# Patient Record
Sex: Male | Born: 1955 | Race: White | Hispanic: No | Marital: Married | Smoking: Never smoker
Health system: Southern US, Community
[De-identification: ages and names within clinical notes are randomized; demographics above are authoritative.]

## PROBLEM LIST (undated history)

## (undated) DIAGNOSIS — E7800 Pure hypercholesterolemia, unspecified: Secondary | ICD-10-CM

## (undated) DIAGNOSIS — A90 Dengue fever [classical dengue]: Secondary | ICD-10-CM

## (undated) DIAGNOSIS — N400 Enlarged prostate without lower urinary tract symptoms: Secondary | ICD-10-CM

## (undated) DIAGNOSIS — T7840XA Allergy, unspecified, initial encounter: Secondary | ICD-10-CM

## (undated) DIAGNOSIS — F419 Anxiety disorder, unspecified: Secondary | ICD-10-CM

## (undated) HISTORY — PX: HERNIA REPAIR: SHX51

## (undated) HISTORY — DX: Allergy, unspecified, initial encounter: T78.40XA

## (undated) HISTORY — DX: Benign prostatic hyperplasia without lower urinary tract symptoms: N40.0

## (undated) HISTORY — PX: PROSTATE BIOPSY: SHX241

## (undated) HISTORY — PX: COLONOSCOPY: SHX174

## (undated) HISTORY — DX: Anxiety disorder, unspecified: F41.9

---
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
## 2010-07-23 DIAGNOSIS — A90 Dengue fever [classical dengue]: Secondary | ICD-10-CM

## 2010-07-23 HISTORY — DX: Dengue fever (classical dengue): A90

## 2013-11-14 ENCOUNTER — Encounter (HOSPITAL_COMMUNITY): Payer: Self-pay | Admitting: Emergency Medicine

## 2013-11-14 ENCOUNTER — Inpatient Hospital Stay (HOSPITAL_COMMUNITY): Payer: Managed Care, Other (non HMO)

## 2013-11-14 ENCOUNTER — Inpatient Hospital Stay (HOSPITAL_COMMUNITY)
Admission: EM | Admit: 2013-11-14 | Discharge: 2013-11-16 | DRG: 607 | Disposition: A | Discharge status: Home / Self Care | Payer: Managed Care, Other (non HMO) | Attending: Internal Medicine | Admitting: Internal Medicine

## 2013-11-14 DIAGNOSIS — E860 Dehydration: Secondary | ICD-10-CM | POA: Diagnosis present

## 2013-11-14 DIAGNOSIS — E7800 Pure hypercholesterolemia, unspecified: Secondary | ICD-10-CM | POA: Diagnosis present

## 2013-11-14 DIAGNOSIS — IMO0001 Reserved for inherently not codable concepts without codable children: Secondary | ICD-10-CM | POA: Diagnosis present

## 2013-11-14 DIAGNOSIS — T7840XA Allergy, unspecified, initial encounter: Secondary | ICD-10-CM | POA: Diagnosis present

## 2013-11-14 DIAGNOSIS — R3129 Other microscopic hematuria: Secondary | ICD-10-CM | POA: Diagnosis present

## 2013-11-14 DIAGNOSIS — Z79899 Other long term (current) drug therapy: Secondary | ICD-10-CM

## 2013-11-14 DIAGNOSIS — N410 Acute prostatitis: Secondary | ICD-10-CM | POA: Diagnosis present

## 2013-11-14 DIAGNOSIS — N419 Inflammatory disease of prostate, unspecified: Secondary | ICD-10-CM | POA: Diagnosis present

## 2013-11-14 DIAGNOSIS — R21 Rash and other nonspecific skin eruption: Secondary | ICD-10-CM | POA: Diagnosis present

## 2013-11-14 DIAGNOSIS — L270 Generalized skin eruption due to drugs and medicaments taken internally: Principal | ICD-10-CM | POA: Diagnosis present

## 2013-11-14 DIAGNOSIS — R509 Fever, unspecified: Secondary | ICD-10-CM | POA: Diagnosis present

## 2013-11-14 DIAGNOSIS — R319 Hematuria, unspecified: Secondary | ICD-10-CM

## 2013-11-14 DIAGNOSIS — I10 Essential (primary) hypertension: Secondary | ICD-10-CM | POA: Diagnosis present

## 2013-11-14 DIAGNOSIS — T50995A Adverse effect of other drugs, medicaments and biological substances, initial encounter: Secondary | ICD-10-CM

## 2013-11-14 DIAGNOSIS — A90 Dengue fever [classical dengue]: Secondary | ICD-10-CM | POA: Diagnosis present

## 2013-11-14 HISTORY — DX: Pure hypercholesterolemia, unspecified: E78.00

## 2013-11-14 HISTORY — DX: Dengue fever (classical dengue): A90

## 2013-11-14 LAB — CBC WITH DIFFERENTIAL/PLATELET
BASOS ABS: 0.1 10*3/uL (ref 0.0–0.1)
Basophils Relative: 2 % — ABNORMAL HIGH (ref 0–1)
EOS PCT: 3 % (ref 0–5)
Eosinophils Absolute: 0.1 10*3/uL (ref 0.0–0.7)
HCT: 42.8 % (ref 39.0–52.0)
Hemoglobin: 15.3 g/dL (ref 13.0–17.0)
LYMPHS ABS: 1.1 10*3/uL (ref 0.7–4.0)
Lymphocytes Relative: 23 % (ref 12–46)
MCH: 31 pg (ref 26.0–34.0)
MCHC: 35.7 g/dL (ref 30.0–36.0)
MCV: 86.6 fL (ref 78.0–100.0)
Monocytes Absolute: 0.6 10*3/uL (ref 0.1–1.0)
Monocytes Relative: 12 % (ref 3–12)
NEUTROS ABS: 3 10*3/uL (ref 1.7–7.7)
NEUTROS PCT: 61 % (ref 43–77)
PLATELETS: 146 10*3/uL — AB (ref 150–400)
RBC: 4.94 MIL/uL (ref 4.22–5.81)
RDW: 12.4 % (ref 11.5–15.5)
WBC: 4.9 10*3/uL (ref 4.0–10.5)

## 2013-11-14 LAB — URINALYSIS, ROUTINE W REFLEX MICROSCOPIC
Glucose, UA: NEGATIVE mg/dL
KETONES UR: 15 mg/dL — AB
Leukocytes, UA: NEGATIVE
NITRITE: NEGATIVE
Protein, ur: 100 mg/dL — AB
SPECIFIC GRAVITY, URINE: 1.044 — AB (ref 1.005–1.030)
UROBILINOGEN UA: 0.2 mg/dL (ref 0.0–1.0)
pH: 5.5 (ref 5.0–8.0)

## 2013-11-14 LAB — COMPREHENSIVE METABOLIC PANEL
ALK PHOS: 79 U/L (ref 39–117)
ALT: 23 U/L (ref 0–53)
AST: 34 U/L (ref 0–37)
Albumin: 3.6 g/dL (ref 3.5–5.2)
BILIRUBIN TOTAL: 0.7 mg/dL (ref 0.3–1.2)
BUN: 20 mg/dL (ref 6–23)
CHLORIDE: 98 meq/L (ref 96–112)
CO2: 22 mEq/L (ref 19–32)
Calcium: 8.9 mg/dL (ref 8.4–10.5)
Creatinine, Ser: 1.1 mg/dL (ref 0.50–1.35)
GFR, EST AFRICAN AMERICAN: 86 mL/min — AB (ref >90)
GFR, EST NON AFRICAN AMERICAN: 74 mL/min — AB (ref >90)
GLUCOSE: 105 mg/dL — AB (ref 70–99)
POTASSIUM: 4.1 meq/L (ref 3.7–5.3)
Sodium: 134 mEq/L — ABNORMAL LOW (ref 137–147)
Total Protein: 7.3 g/dL (ref 6.0–8.3)

## 2013-11-14 LAB — MAGNESIUM: MAGNESIUM: 1.8 mg/dL (ref 1.5–2.5)

## 2013-11-14 LAB — URINE MICROSCOPIC-ADD ON

## 2013-11-14 LAB — LACTIC ACID, PLASMA: Lactic Acid, Venous: 2.2 mmol/L (ref 0.5–2.2)

## 2013-11-14 MED ORDER — HYDROXYZINE HCL 10 MG PO TABS
10.0000 mg | ORAL_TABLET | Freq: Three times a day (TID) | ORAL | Status: DC | PRN
  Filled 2013-11-14: qty 1

## 2013-11-14 MED ORDER — DIPHENHYDRAMINE HCL 50 MG/ML IJ SOLN
25.0000 mg | Freq: Three times a day (TID) | INTRAMUSCULAR | Status: DC
  Administered 2013-11-14 – 2013-11-15 (×3): 25 mg via INTRAVENOUS
  Filled 2013-11-14 (×2): qty 1
  Filled 2013-11-14 (×3): qty 0.5
  Filled 2013-11-14: qty 1

## 2013-11-14 MED ORDER — OXYCODONE HCL 5 MG PO TABS: 5.0000 mg | ORAL_TABLET | ORAL | Status: DC | PRN

## 2013-11-14 MED ORDER — ONDANSETRON HCL 4 MG/2ML IJ SOLN: 4.0000 mg | Freq: Four times a day (QID) | INTRAMUSCULAR | Status: DC | PRN

## 2013-11-14 MED ORDER — ENOXAPARIN SODIUM 40 MG/0.4ML ~~LOC~~ SOLN
40.0000 mg | SUBCUTANEOUS | Status: DC
  Administered 2013-11-14 – 2013-11-15 (×2): 40 mg via SUBCUTANEOUS
  Filled 2013-11-14 (×3): qty 0.4

## 2013-11-14 MED ORDER — IPRATROPIUM BROMIDE 0.02 % IN SOLN: 0.5000 mg | RESPIRATORY_TRACT | Status: DC | PRN

## 2013-11-14 MED ORDER — ALBUTEROL SULFATE (2.5 MG/3ML) 0.083% IN NEBU: 2.5000 mg | INHALATION_SOLUTION | RESPIRATORY_TRACT | Status: DC | PRN

## 2013-11-14 MED ORDER — MAGNESIUM CITRATE PO SOLN: 1.0000 | Freq: Once | ORAL | Status: AC | PRN

## 2013-11-14 MED ORDER — ONDANSETRON HCL 4 MG PO TABS: 4.0000 mg | ORAL_TABLET | Freq: Four times a day (QID) | ORAL | Status: DC | PRN

## 2013-11-14 MED ORDER — MORPHINE SULFATE 2 MG/ML IJ SOLN: 1.0000 mg | INTRAMUSCULAR | Status: DC | PRN

## 2013-11-14 MED ORDER — SODIUM CHLORIDE 0.9 % IV BOLUS (SEPSIS)
1000.0000 mL | Freq: Once | INTRAVENOUS | Status: AC
  Administered 2013-11-14: 1000 mL via INTRAVENOUS

## 2013-11-14 MED ORDER — ACETAMINOPHEN 325 MG PO TABS: 650.0000 mg | ORAL_TABLET | Freq: Four times a day (QID) | ORAL | Status: DC | PRN

## 2013-11-14 MED ORDER — DOCUSATE SODIUM 100 MG PO CAPS
100.0000 mg | ORAL_CAPSULE | Freq: Two times a day (BID) | ORAL | Status: DC
  Administered 2013-11-14: 100 mg via ORAL
  Filled 2013-11-14 (×6): qty 1

## 2013-11-14 MED ORDER — IOHEXOL 300 MG/ML  SOLN
100.0000 mL | Freq: Once | INTRAMUSCULAR | Status: AC | PRN
  Administered 2013-11-14: 100 mL via INTRAVENOUS

## 2013-11-14 MED ORDER — IOHEXOL 300 MG/ML  SOLN
50.0000 mL | Freq: Once | INTRAMUSCULAR | Status: AC | PRN
  Administered 2013-11-14: 50 mL via ORAL

## 2013-11-14 MED ORDER — ACETAMINOPHEN 650 MG RE SUPP: 650.0000 mg | Freq: Four times a day (QID) | RECTAL | Status: DC | PRN

## 2013-11-14 MED ORDER — SODIUM CHLORIDE 0.9 % IV SOLN
1250.0000 mg | Freq: Two times a day (BID) | INTRAVENOUS | Status: DC
  Filled 2013-11-14: qty 1250

## 2013-11-14 MED ORDER — POLYETHYLENE GLYCOL 3350 17 G PO PACK
17.0000 g | PACK | Freq: Every day | ORAL | Status: DC | PRN
  Filled 2013-11-14: qty 1

## 2013-11-14 MED ORDER — FAMOTIDINE IN NACL 20-0.9 MG/50ML-% IV SOLN
20.0000 mg | Freq: Two times a day (BID) | INTRAVENOUS | Status: DC
  Administered 2013-11-14 – 2013-11-15 (×3): 20 mg via INTRAVENOUS
  Filled 2013-11-14 (×4): qty 50

## 2013-11-14 MED ORDER — METHYLPREDNISOLONE SODIUM SUCC 125 MG IJ SOLR
125.0000 mg | Freq: Once | INTRAMUSCULAR | Status: AC
  Administered 2013-11-14: 125 mg via INTRAVENOUS
  Filled 2013-11-14: qty 2

## 2013-11-14 MED ORDER — METHYLPREDNISOLONE SODIUM SUCC 125 MG IJ SOLR
125.0000 mg | Freq: Three times a day (TID) | INTRAMUSCULAR | Status: DC
  Administered 2013-11-14 – 2013-11-15 (×3): 125 mg via INTRAVENOUS
  Filled 2013-11-14 (×6): qty 2

## 2013-11-14 MED ORDER — DIPHENHYDRAMINE HCL 50 MG/ML IJ SOLN
25.0000 mg | Freq: Once | INTRAMUSCULAR | Status: AC
Start: 2013-11-14 — End: 2013-11-14
  Administered 2013-11-14: 25 mg via INTRAVENOUS
  Filled 2013-11-14: qty 1

## 2013-11-14 MED ORDER — MORPHINE SULFATE 4 MG/ML IJ SOLN
2.0000 mg | Freq: Once | INTRAMUSCULAR | Status: AC
  Administered 2013-11-14: 2 mg via INTRAVENOUS
  Filled 2013-11-14: qty 1

## 2013-11-14 MED ORDER — HYDROXYZINE HCL 25 MG PO TABS
25.0000 mg | ORAL_TABLET | Freq: Three times a day (TID) | ORAL | Status: DC | PRN
  Filled 2013-11-14: qty 1

## 2013-11-14 MED ORDER — MORPHINE SULFATE 4 MG/ML IJ SOLN
4.0000 mg | Freq: Once | INTRAMUSCULAR | Status: AC
  Administered 2013-11-14: 4 mg via INTRAVENOUS
  Filled 2013-11-14: qty 1

## 2013-11-14 MED ORDER — SODIUM CHLORIDE 0.9 % IV SOLN
500.0000 mg | Freq: Four times a day (QID) | INTRAVENOUS | Status: DC
  Filled 2013-11-14 (×3): qty 500

## 2013-11-14 MED ORDER — DIPHENHYDRAMINE HCL 50 MG/ML IJ SOLN
INTRAMUSCULAR | Status: AC
  Filled 2013-11-14: qty 1

## 2013-11-14 MED ORDER — VANCOMYCIN HCL 10 G IV SOLR
2000.0000 mg | Freq: Once | INTRAVENOUS | Status: DC
  Filled 2013-11-14: qty 2000

## 2013-11-14 MED ORDER — SODIUM CHLORIDE 0.9 % IV SOLN
INTRAVENOUS | Status: DC
  Administered 2013-11-14 – 2013-11-16 (×5): via INTRAVENOUS
  Filled 2013-11-14 (×8): qty 1000

## 2013-11-14 MED ORDER — SORBITOL 70 % SOLN: 30.0000 mL | Freq: Every day | Status: DC | PRN

## 2013-11-14 NOTE — ED Notes (Addendum)
Pt returned from xray currently drinking contrast. Pt reports relief from joint pain but reports pain 5/10 in abdomen.

## 2013-11-14 NOTE — ED Notes (Signed)
Ajsa unsuccessful IV attempt. IV team paged.

## 2013-11-14 NOTE — Consult Note (Addendum)
[facility] for Infectious Disease  Date of Admission:  11/14/2013  Date of Consult:  11/14/2013  Reason for Consult: Fever, rash Referring Physician: Thompson  Impression/Recommendation Fever Suspected ADR Recent prostate Bx  Would Continue steroids Continue H2 blocker Continue benardyl Hold anbx Await BCx, UCx.   Comment- His WBC is normal and his CT does not show evidence of infection. His UA shows blood but no leukocyte esterase or nitrates. He does not appear toxic and is asking about d/c.  His case certainly seems like a drug reaction.it is not clear to me if this is from bactrim, levaquin and/or ceftriaxone. Temporally it coincided with ceftriaxone dose however this does not explain his prior fevers.   I do not believe that he has measles, dengue, or chickungunya.... As well, if he had RMSF or Ehrlichiosis as I would expect that his platelets, hemoglobin/LFTs would be abnormal  Thank you so much for this interesting consult,   Campbell Riches (pager) [phone] [email or web address]  Stephen Sloan is an 58 y.o. male.  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 On 4-22 he developed low grade fever (100.9) and was given a rx for levaquin. He took 564m wed pm, then 5060mtid on thrusday (misread rx label- each day, after meals). He continued to have fever on Thursday (101.5) on and Friday he had an injection of ceftriaxone (~4pm). By 7pm he had fever and diffuse rash.  His vax are up to date.  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   Past Medical History  Diagnosis Date  . Dengue fever   . Hypercholesteremia     Past Surgical History  Procedure Laterality Date  . Hernia repair    . Prostate biopsy       Allergies  Allergen Reactions  . Levaquin [Levofloxacin] Rash    Suspect but pt unsure, on multiple abx  this week  . Rocephin [Ceftriaxone Sodium In Dextrose] Rash    Suspect this drug, patient unsure as was on multiple abx this week    Medications:  Scheduled: . diphenhydrAMINE      . diphenhydrAMINE  25 mg Intravenous 3 times per day  . docusate sodium  100 mg Oral BID  . enoxaparin (LOVENOX) injection  40 mg Subcutaneous Q24H  . famotidine (PEPCID) IV  20 mg Intravenous Q12H  . methylPREDNISolone (SOLU-MEDROL) injection  125 mg Intravenous 3 times per day    Abtx:  Anti-infectives   Start     Dose/Rate Route Frequency Ordered Stop   11/14/13 2200  vancomycin (VANCOCIN) 1,250 mg in sodium chloride 0.9 % 250 mL IVPB  Status:  Discontinued     1,250 mg 166.7 mL/hr over 90 Minutes Intravenous Every 12 hours 11/14/13 1004 11/14/13 1007   11/14/13 1030  vancomycin (VANCOCIN) 2,000 mg in sodium chloride 0.9 % 500 mL IVPB  Status:  Discontinued     2,000 mg 250 mL/hr over 120 Minutes Intravenous  Once 11/14/13 1004 11/14/13 1007   11/14/13 1030  imipenem-cilastatin (PRIMAXIN) 500 mg in sodium chloride 0.9 % 100 mL IVPB  Status:  Discontinued     500 mg 200 mL/hr over 30 Minutes Intravenous 4 times per day 11/14/13 1004 11/14/13 1007      Total days of antibiotics 6          Social  History:  reports that he has never smoked. He does not have any smokeless tobacco history on file. He reports that he does not drink alcohol or use illicit drugs.  History reviewed. No pertinent family history.  General ROS: no dysphagia, no SOB, +DOE, has felt weak, has had back pain, + hematuria, normal BM, no vision change, no headache, no dysuria, no odor to urine. see HPI.   Blood pressure 107/63, pulse 73, temperature 98.3 F (36.8 C), temperature source Oral, resp. rate 18, height 6' (1.829 m), weight 92.987 kg (205 lb), SpO2 99.00%. General appearance: alert, cooperative and no distress Eyes: negative findings: conjunctivae and sclerae normal and pupils equal, round, reactive to light and  accomodation Throat: lips, mucosa, and tongue normal; teeth and gums normal Neck: no adenopathy, supple, symmetrical, trachea midline and FROM Lungs: clear to auscultation bilaterally Heart: regular rate and rhythm Abdomen: normal findings: bowel sounds normal and soft, non-tender Extremities: edema none Skin: papular - scattered   Results for orders placed during the hospital encounter of 11/14/13 (from the past 48 hour(s))  URINALYSIS, ROUTINE W REFLEX MICROSCOPIC     Status: Abnormal   Collection Time    11/14/13  6:39 AM      Result Value Ref Range   Color, Urine AMBER (*) YELLOW   Comment: BIOCHEMICALS MAY BE AFFECTED BY COLOR   APPearance CLOUDY (*) CLEAR   Specific Gravity, Urine 1.044 (*) 1.005 - 1.030   pH 5.5  5.0 - 8.0   Glucose, UA NEGATIVE  NEGATIVE mg/dL   Hgb urine dipstick LARGE (*) NEGATIVE   Bilirubin Urine SMALL (*) NEGATIVE   Ketones, ur 15 (*) NEGATIVE mg/dL   Protein, ur 100 (*) NEGATIVE mg/dL   Urobilinogen, UA 0.2  0.0 - 1.0 mg/dL   Nitrite NEGATIVE  NEGATIVE   Leukocytes, UA NEGATIVE  NEGATIVE  URINE MICROSCOPIC-ADD ON     Status: Abnormal   Collection Time    11/14/13  6:39 AM      Result Value Ref Range   Squamous Epithelial / LPF FEW (*) RARE   RBC / HPF TOO NUMEROUS TO COUNT  <3 RBC/hpf   Bacteria, UA FEW (*) RARE   Urine-Other MUCOUS PRESENT    CBC WITH DIFFERENTIAL     Status: Abnormal   Collection Time    11/14/13  8:10 AM      Result Value Ref Range   WBC 4.9  4.0 - 10.5 K/uL   RBC 4.94  4.22 - 5.81 MIL/uL   Hemoglobin 15.3  13.0 - 17.0 g/dL   HCT 42.8  39.0 - 52.0 %   MCV 86.6  78.0 - 100.0 fL   MCH 31.0  26.0 - 34.0 pg   MCHC 35.7  30.0 - 36.0 g/dL   RDW 12.4  11.5 - 15.5 %   Platelets 146 (*) 150 - 400 K/uL   Neutrophils Relative % 61  43 - 77 %   Neutro Abs 3.0  1.7 - 7.7 K/uL   Lymphocytes Relative 23  12 - 46 %   Lymphs Abs 1.1  0.7 - 4.0 K/uL   Monocytes Relative 12  3 - 12 %   Monocytes Absolute 0.6  0.1 - 1.0 K/uL    Eosinophils Relative 3  0 - 5 %   Eosinophils Absolute 0.1  0.0 - 0.7 K/uL   Basophils Relative 2 (*) 0 - 1 %   Basophils Absolute 0.1  0.0 - 0.1 K/uL  COMPREHENSIVE METABOLIC PANEL  Status: Abnormal   Collection Time    11/14/13  8:10 AM      Result Value Ref Range   Sodium 134 (*) 137 - 147 mEq/L   Potassium 4.1  3.7 - 5.3 mEq/L   Chloride 98  96 - 112 mEq/L   CO2 22  19 - 32 mEq/L   Glucose, Bld 105 (*) 70 - 99 mg/dL   BUN 20  6 - 23 mg/dL   Creatinine, Ser 1.10  0.50 - 1.35 mg/dL   Calcium 8.9  8.4 - 10.5 mg/dL   Total Protein 7.3  6.0 - 8.3 g/dL   Albumin 3.6  3.5 - 5.2 g/dL   AST 34  0 - 37 U/L   ALT 23  0 - 53 U/L   Alkaline Phosphatase 79  39 - 117 U/L   Total Bilirubin 0.7  0.3 - 1.2 mg/dL   GFR calc non Af Amer 74 (*) >90 mL/min   GFR calc Af Amer 86 (*) >90 mL/min   Comment: (NOTE)     The eGFR has been calculated using the CKD EPI equation.     This calculation has not been validated in all clinical situations.     eGFR's persistently <90 mL/min signify possible Chronic Kidney     Disease.  MAGNESIUM     Status: None   Collection Time    11/14/13  8:10 AM      Result Value Ref Range   Magnesium 1.8  1.5 - 2.5 mg/dL  LACTIC ACID, PLASMA     Status: None   Collection Time    11/14/13  9:51 AM      Result Value Ref Range   Lactic Acid, Venous 2.2  0.5 - 2.2 mmol/L   No results found for this basename: sdes, specrequest, cult, reptstatus   Dg Chest 2 View  11/14/2013   CLINICAL DATA:  Fever, rash, recent prostate biopsy  EXAM: CHEST  2 VIEW  COMPARISON:  None.  FINDINGS: Vague patchy opacity overlying the right mid lung, equivocal and favored to reflect prominent pulmonary markings, although pneumonia is not excluded. No pleural effusion or pneumothorax.  The heart is normal in size.  Degenerative changes of the visualized thoracolumbar spine.  IMPRESSION: Vague patchy opacity overlying the right mid lung, equivocal, pneumonia not excluded.   Electronically  Signed   By: Julian Hy M.D.   On: 11/14/2013 10:27   Ct Abdomen Pelvis W Contrast  11/14/2013   CLINICAL DATA:  Fever and rash.  Recent prostate biopsy  EXAM: CT ABDOMEN AND PELVIS WITH CONTRAST  TECHNIQUE: Multidetector CT imaging of the abdomen and pelvis was performed using the standard protocol following bolus administration of intravenous contrast.  CONTRAST:  36m OMNIPAQUE IOHEXOL 300 MG/ML SOLN, 1022mOMNIPAQUE IOHEXOL 300 MG/ML SOLN  COMPARISON:  None.  FINDINGS: Lung bases are clear.  No pericardial fluid.  No focal hepatic lesion. The gallbladder, pancreas, spleen, adrenal glands, and kidneys are normal.  The stomach, small bowel, and cecum are normal. Colon and rectosigmoid colon are normal.  Abdominal aorta is normal caliber. No retroperitoneal periportal lymphadenopathy. No free fluid in the pelvis. The prostate gland is normal. No evidence of prostatic inflammation. No fluid in the pelvis. The bladder likewise appears normal. No pelvic lymphadenopathy. No aggressive osseous lesion.  IMPRESSION: 1. No CT evidence of prostate infection or pelvic infection. 2. No evidence of infection or inflammation in the abdomen.   Electronically Signed   By: StSuzy Bouchard  M.D.   On: 11/14/2013 14:23   No results found for this or any previous visit (from the past 240 hour(s)).    11/14/2013, 3:28 PM     LOS: 0 days     **Disclaimer: This note may have been dictated with voice recognition software. Similar sounding words can inadvertently be transcribed and this note may contain transcription errors which may not have been corrected upon publication of note.**

## 2013-11-14 NOTE — ED Notes (Signed)
Unsuccessful IV attempt by this RN x2. Ajsa at bedside attempting IV insertion at present time.

## 2013-11-14 NOTE — Progress Notes (Signed)
Patient ID: Stephen Sloan, male   DOB: Nov 21, 1955, 58 y.o.   MRN: [id number]  Here is the most recent office note on Mr. Stephen Sloan from 4/24.    A urine culture is pending in our office and should be available tomorrow.     eason For Visit  Seen today for an urgent work-in post TRUS BX 11/09/13.   Active Problems Problems   1. Fever (780.60)   Assessed By: Jimmey Ralph (Urology); Last Assessed: 13 Nov 2013  2. Microscopic hematuria (599.72)   Assessed By: Jimmey Ralph (Urology); Last Assessed: 13 Nov 2013  3. Myalgia (729.1)   Assessed By: Jimmey Ralph (Urology); Last Assessed: 13 Nov 2013  History of Present Illness         58 YO male patient of Dr. Zettie Pho seen today as an urgent work-in for ? post TRUS BX infection (at time of BX received Gentamicin 80 mg IM, SMZ-TMP DS 1 po BID X 3 days, and Levofloxacin 500 mg 1 po daily X 7 days which was started 24 hrs ago). Has chills, fever, and myalgia now for 2 days post BX. Temp has been as high as 100.9 with chills. Has been using PRN Tylenol to antipyretic. 2 hrs ago temp 101.5 but post Tylenol 99.3. States no change in appetite is able to eat and drink well. Hematuria has almost completely resolved. Denies n/v.   Past Medical History Problems   1. History of Anxiety (300.00)  2. 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 History of hypercholesterolemia (V12.29)  Hx of Dengue Fever 2 yrs. from visiting [place].   Surgical History Problems   1. History of Hernia Repair  Current Meds  1. Aleve 220 MG Oral Capsule;  Therapy: (Recorded:16Mar2015) to Recorded  2. Amitriptyline HCl - 25 MG Oral Tablet;  Therapy: (Recorded:16Mar2015) to Recorded  3. Aspirin 81 MG Oral Tablet;  Therapy: (Recorded:16Mar2015) to Recorded  4. Levofloxacin 500 MG Oral Tablet; TAKE 1 TABLET After meals;  Therapy: 23Apr2015 to (Evaluate:30Apr2015)  Requested for: 23Apr2015; Last  Rx:23Apr2015 Ordered  5. Pravastatin Sodium 20 MG Oral Tablet;  Therapy:  (Recorded:16Mar2015) to Recorded  6. Sulfamethoxazole-TMP DS 800-160 MG Oral Tablet; 1 tab po BID x 6 days. Begin 3 days  before prostate biopsy;  Therapy: [id number] to (Last Rx:16Mar2015) Ordered  Allergies Medication   1. No Known Drug Allergies  Family History Problems   1. Family history of Death of family member : Father   73/ lymphoma  2. Family history of cardiac disorder (V17.49) : Father  3. Family history of kidney cancer (V16.51) : Father  4. Family history of kidney stones (V18.69) : Father  5. Family history of lymphoma (V16.7) : Father  6. Family history of Hematuria : Father  Social History Problems   1. Denied: History of Alcohol use  2. Caffeine use (V49.89)   5 per day  3. Married  4. Never a smoker  5. 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  Genitourinary: hematuria.  Constitutional: fever, feeling poorly (malaise) and chills.    Vitals Vital Signs [Data Includes: Last 1 Day]  Recorded: 24Apr2015 03:42PM  Temperature: 99.9 F Recorded: 24Apr2015 03:16PM  Blood Pressure: 125 / 77 Temperature: 99.3 F Heart Rate: 84  Physical Exam Constitutional: Well nourished and well developed .  No acute distress. The patient appears well hydrated.  Abdomen: The abdomen is flat. The abdomen is soft and nontender. No suprapubic tenderness.  Skin: Normal skin turgor.  Neuro/Psych:. Mood and affect are appropriate.    Results/Data Urine [Data Includes: Last 1 Day]   24Apr2015  COLOR AMBER   APPEARANCE CLEAR   SPECIFIC GRAVITY >1.030   pH 5.5   GLUCOSE NEG mg/dL  BILIRUBIN NEG   KETONE NEG mg/dL  BLOOD LARGE   PROTEIN 30 mg/dL  UROBILINOGEN 0.2 mg/dL   NITRITE NEG   LEUKOCYTE ESTERASE NEG   SQUAMOUS EPITHELIAL/HPF RARE   WBC 0-2 WBC/hpf  RBC 7-10 RBC/hpf  BACTERIA NONE SEEN   CRYSTALS NONE SEEN   CASTS NONE SEEN   Other MUCUS    The following clinical lab reports were reviewed:  Stat CBC- normal UA- negative. Microscopic hematuria. Will culture with fever. Selected Results  CBC W/DIFF 24Apr2015 03:20PM Jimmey Ralph  SPECIMEN TYPE: BLOOD RESULTS FAXED PER: KIM 355P 4.24.2015 [place]   Test Name Result Flag Reference  WBC 5.3 K/uL  4.0-10.5  RBC 4.93 MIL/uL  4.22-5.81  HEMOGLOBIN 15.0 g/dL  13.0-17.0  HEMATOCRIT 44.6 %  39.0-52.0  MCV 90.5 fL  78.0-100.0  MCH 30.4 pg  26.0-34.0  MCHC 33.7 g/dL  30.0-36.0  RDW 12.4 %  11.5-15.5  PLATELET COUNT 160 K/uL  150-400  GRANULOCYTE % 80 % H 43-77  LYMPH % 15 %  12-46  MONO % 5 %  3-12  ABSOLUTE GRAN 4.2 K/uL  1.7-7.7  ABSOLUTE LYMPH 0.8 K/uL  0.7-4.0  ABSOLUTE MONO 0.3 K/uL  0.1-1.0  ABSOLUTE EOS 0.0 K/uL  0.0-0.7  ABSOLUTE BASO 0.0 K/uL  0.0-0.1  SMEAR REVIEW     Criteria for review not met   Assessment Assessed   1. Fever (780.60)  2. Myalgia (729.1)  3. Microscopic hematuria (599.72)  Plan  Fever   1. Administer: CefTRIAXone Sodium 1 GM Injection Solution Reconstituted; INJECT 1 GM  Intramuscular; To Be Done: 24Apr2015  2. Administer: Ibuprofen 400 MG Oral Tablet; TAKE 400  MG Oral; To Be Done: 24Apr2015  3. Administer: Ibuprofen 600 MG Oral Tablet; TAKE 600  MG Oral  4. Follow-up Day x 1 Office  Follow-up  Status: Hold For - Appointment,Date of Service   Requested for: 24Apr2015  5. CBC W/DIFF; Status:Complete;   Done: [id number] 03:20PM  6. VENIPUNCTURE; Status:Complete;   Done: [id number] Health Maintenance   7. UA With REFLEX; [Do Not Release]; Status:Complete;   Done: [id number] 03:08PM                      Discussed either continuing with ABX po as started last pm (has had 3 doses) vs admission for IV hydration and ABX. He would like to not be admitted if  possible. VSS w/o fever >100.5, tachycardia, or hypotension.  Will culture urine.  Ceftriaxone 1 GM IM today Ibuprofen 400 mg po NOW Instructed to begin daily Probiotic with numerous ABX he has had over past 5 days.  Instructed that he can alternate Tylenol/Ibuprofen Q6 hrs PRN temp >100.5 and to call office to speak with on-call MD over weekend if he has any acute changes Will have pt f/u Monday 11/16/13 w/Dr. Tresa Moore   URINE CULTURE; Status:In Progress - Specimen/Data Collected;   Done: 24Apr2015 Perform:[facility]; Due:26Apr2015; Marked Important; Last Updated LH:[id number], Kirtland Bouchard; 11/13/2013 3:30:56 PM;Ordered; Today;   OTL:XBWIO; Ordered MB:TDHRCB, Diane;

## 2013-11-14 NOTE — ED Notes (Signed)
Patient sent by Dr Bjorn PippinJohn Sloan for evaluation of fever and rash. Patient had prostate biopsy last week, patient was on Levoquin for prophylaxis, began running fever and was given additional doses of Levoquin. Patient continued to have a fever and was then given a dose of Rocephin in office. Patient now states to MD he has a rash over his body and is very itchy. Patient reports taking 3 doses of Benedryl without relief PTA. Dr Annabell HowellsWrenn would like patient evaluated for rash, also to evaluate fever. If patient is febrile and/or has an elevated WBC he would like him admitted for IV antibiotics.

## 2013-11-14 NOTE — Progress Notes (Signed)
ANTIBIOTIC CONSULT NOTE - INITIAL  Pharmacy Consult for Vancomycin, Primaxin Indication: fevers after prostate biopsy  No Known Allergies  Patient Measurements: Height: 6' (182.9 cm) Weight: 205 lb (92.987 kg) IBW/kg (Calculated) : 77.6  Vital Signs: Temp: 98.5 F (36.9 C) (04/25 0858) Temp src: Oral (04/25 0858) BP: 126/74 mmHg (04/25 0858) Pulse Rate: 75 (04/25 0858) Intake/Output from previous day:    Labs:  Recent Labs  11/14/13 0810  WBC 4.9  HGB 15.3  PLT 146*  CREATININE 1.10   Estimated Creatinine Clearance: 83.3 ml/min (by C-G formula based on Cr of 1.1).  Microbiology: No results found for this or any previous visit (from the past 720 hour(s)).  Medical History: Past Medical History  Diagnosis Date  . Dengue fever   . Hypercholesteremia     Recent anti-infectives:  Pre-biopsy 4/20:  Gentamicin and Bactrim Post - biopsy 4/23: Levaquin 500mg  PO (inadvertently taking TID instead of once daily) In office 4/24: ceftriaxone 1g IM  Assessment: [age] yoM admitted 4/25 with rash and fevers that developedafter prostate biopsy on 4/20 and have not resolved with outpatient antibiotics (see list above).  Pharmacy is consulted to dose vancomycin and Primaxin.  Tmax: 98.5 (reported Tm 100.9)  WBCs: 4.9  Renal: SCr 1.1, CrCl ~ 83 ml/min CG, ~ 77 ml/min N  Urine and blood cultures have been ordered.   Goal of Therapy:  Vancomycin trough level 15-20 mcg/ml Appropriate abx dosing, eradication of infection.   Plan:   Primaxin 500mg  IV q6h  Vancomycin 2g IV once, then Vancomycin 1250mg  IV q12h.  Measure Vanc trough at steady state.  Follow up renal fxn and culture results.   Lynann Beaverhristine Zarahi Fuerst PharmD, BCPS Pager [phone] 11/14/2013 10:10 AM

## 2013-11-14 NOTE — ED Notes (Signed)
Pt presents with generalized hives and body aches. Pt took Benadryl at home last evening x 3 with no relief.

## 2013-11-14 NOTE — ED Notes (Signed)
Pt transported to xray 

## 2013-11-14 NOTE — ED Notes (Signed)
IV team verbalizes will attempt IV insertion.

## 2013-11-14 NOTE — ED Notes (Signed)
Assessed pt for an IV insertion, unable to successfully obtain it, RN Lequita HaltMorgan made aware.

## 2013-11-14 NOTE — ED Notes (Signed)
Phlebotomist at bedside.

## 2013-11-14 NOTE — ED Provider Notes (Signed)
CSN: [id number]     Arrival date & time 11/14/13  0530 History   None    Chief Complaint  Patient presents with  . Allergic Reaction   HPI  Stephen Sloan is a 58 y.o. male with a PMH of hypercholesteremia and dengue fever who presents to the ED for evaluation of allergic reaction. History was provided by the patient. Patient had a prostate biopsy last Monday 11/09/13 with Dr. Kathrynn RunningManning ([facility]). He was started on Bactrim prior to his biopsy which he took from 11/06/13 to 11/10/13.  Patient developed a fever two days after his biopsy on 11/11/13. Max temp 101.5. He was switched to Levaquin on 11/11/13 and received a shot of Rocephin in the office yesterday 11/13/13. Patient states he developed a rash yesterday, which is located throughout his body. He states the rash is itchy. He has been taking Benadryl and applying hydrocortisone cream with no relief. No hx of allergies in the past. No oral swelling, wheezing, dyspnea, or lightheadedness. Patient also complains of lower middle pelvic pain, which he describes as an aching pain. Pain has been present since his biopsy, but has worsened over the past 24 hours. He describes this as an aching sensation worse with movement. He also has diffuse lower back pain. He denies any emesis, nausea, constipation, diarrhea, or dysuria. Has had mild hematuria but was told this is normal after a biopsy. He also has had fatigue, myalgias and arthralgias. Patient called the on call physician at [facility] and spoke with Dr. Annabell HowellsWrenn. Patient informed he will be admitted if he has a fever or leukocytosis. [facility] (Dr. Foy GuadalajaraFried)    Past Medical History  Diagnosis Date  . Dengue fever   . Hypercholesteremia    Past Surgical History  Procedure Laterality Date  . Hernia repair    . Prostate biopsy     No family history on file. History  Substance Use Topics  . Smoking status: Never Smoker   . Smokeless tobacco: Not on file  . Alcohol Use: No     Review of Systems  Constitutional: Positive for fever and fatigue. Negative for chills, diaphoresis, activity change and appetite change.  HENT: Negative for sore throat.   Respiratory: Negative for cough and shortness of breath.   Cardiovascular: Negative for chest pain and leg swelling.  Gastrointestinal: Positive for abdominal pain. Negative for nausea, vomiting, diarrhea, constipation, blood in stool and anal bleeding.  Genitourinary: Positive for hematuria. Negative for dysuria, urgency, frequency, decreased urine volume, penile swelling, scrotal swelling, difficulty urinating, penile pain and testicular pain.  Musculoskeletal: Positive for arthralgias, back pain and myalgias. Negative for gait problem, joint swelling, neck pain and neck stiffness.  Skin: Positive for color change and rash.  Neurological: Negative for dizziness, weakness, light-headedness and headaches.    Allergies  Review of patient's allergies indicates no known allergies.  Home Medications   Prior to Admission medications   Medication Sig Start Date End Date Taking? Authorizing Provider  acetaminophen (TYLENOL) 325 MG tablet Take 650 mg by mouth every 6 (six) hours as needed for mild pain.   Yes Historical Provider, MD  CefTRIAXone Sodium (ROCEPHIN IJ) Inject 1 mL as directed once.   Yes Historical Provider, MD  diphenhydrAMINE (BENADRYL) 25 mg capsule Take 25 mg by mouth every 6 (six) hours as needed for itching.   Yes Historical Provider, MD  hydrocortisone cream 1 % Apply 1 application topically 2 (two) times daily.   Yes Historical Provider, MD  levofloxacin (LEVAQUIN) 500 MG tablet Take 500 mg by mouth daily.   Yes Historical Provider, MD   BP 108/68  Pulse 84  Temp(Src) 97.8 F (36.6 C) (Oral)  Resp 20  Ht 6' (1.829 m)  Wt 205 lb (92.987 kg)  BMI 27.80 kg/m2  SpO2 100%  Filed Vitals:   11/14/13 0547 11/14/13 0706 11/14/13 0736 11/14/13 0858  BP:  117/73 102/78 126/74  Pulse:  107 72 75   Temp:  98.2 F (36.8 C)  98.5 F (36.9 C)  TempSrc:  Oral  Oral  Resp:  20  16  Height: 6' (1.829 m)     Weight: 205 lb (92.987 kg)     SpO2:  94% 99% 99%    Physical Exam  Nursing note and vitals reviewed. Constitutional: He is oriented to person, place, and time. He appears well-developed and well-nourished. No distress.  HENT:  Head: Normocephalic and atraumatic.  Right Ear: External ear normal.  Left Ear: External ear normal.  Nose: Nose normal.  Mouth/Throat: Oropharynx is clear and moist.  No periorbital or oral edema.   Eyes: Conjunctivae are normal. Right eye exhibits no discharge. Left eye exhibits no discharge.  Neck: Normal range of motion. Neck supple.  Cardiovascular: Normal rate, regular rhythm and normal heart sounds.  Exam reveals no gallop and no friction rub.   No murmur heard. Pulmonary/Chest: Effort normal and breath sounds normal. No respiratory distress. He has no wheezes. He has no rales. He exhibits no tenderness.  Abdominal: Soft. Bowel sounds are normal. He exhibits no distension and no mass. There is tenderness. There is no rebound and no guarding.  Mild middle lower abdominal tenderness to palpation.   Musculoskeletal: Normal range of motion. He exhibits tenderness. He exhibits no edema.  Diffuse tenderness to palpation to the lower paraspinal muscles bilaterally.   Neurological: He is alert and oriented to person, place, and time.  Skin: Skin is warm and dry. Rash noted. He is not diaphoretic.  Erythematous confluent macular rash throughout the chest, back, UE and LE. No open wounds/lesions or edema throughout.     ED Course  Procedures (including critical care time) Labs Review Labs Reviewed - No data to display  Imaging Review No results found.   EKG Interpretation None      Results for orders placed during the hospital encounter of 11/14/13  CBC WITH DIFFERENTIAL      Result Value Ref Range   WBC 4.9  4.0 - 10.5 K/uL   RBC 4.94   4.22 - 5.81 MIL/uL   Hemoglobin 15.3  13.0 - 17.0 g/dL   HCT 81.1  91.4 - 78.2 %   MCV 86.6  78.0 - 100.0 fL   MCH 31.0  26.0 - 34.0 pg   MCHC 35.7  30.0 - 36.0 g/dL   RDW 95.6  21.3 - 08.6 %   Platelets 146 (*) 150 - 400 K/uL   Neutrophils Relative % 61  43 - 77 %   Neutro Abs 3.0  1.7 - 7.7 K/uL   Lymphocytes Relative 23  12 - 46 %   Lymphs Abs 1.1  0.7 - 4.0 K/uL   Monocytes Relative 12  3 - 12 %   Monocytes Absolute 0.6  0.1 - 1.0 K/uL   Eosinophils Relative 3  0 - 5 %   Eosinophils Absolute 0.1  0.0 - 0.7 K/uL   Basophils Relative 2 (*) 0 - 1 %   Basophils Absolute 0.1  0.0 -  0.1 K/uL  COMPREHENSIVE METABOLIC PANEL      Result Value Ref Range   Sodium 134 (*) 137 - 147 mEq/L   Potassium 4.1  3.7 - 5.3 mEq/L   Chloride 98  96 - 112 mEq/L   CO2 22  19 - 32 mEq/L   Glucose, Bld 105 (*) 70 - 99 mg/dL   BUN 20  6 - 23 mg/dL   Creatinine, Ser 1.611.10  0.50 - 1.35 mg/dL   Calcium 8.9  8.4 - 09.610.5 mg/dL   Total Protein 7.3  6.0 - 8.3 g/dL   Albumin 3.6  3.5 - 5.2 g/dL   AST 34  0 - 37 U/L   ALT 23  0 - 53 U/L   Alkaline Phosphatase 79  39 - 117 U/L   Total Bilirubin 0.7  0.3 - 1.2 mg/dL   GFR calc non Af Amer 74 (*) >90 mL/min   GFR calc Af Amer 86 (*) >90 mL/min  URINALYSIS, ROUTINE W REFLEX MICROSCOPIC      Result Value Ref Range   Color, Urine AMBER (*) YELLOW   APPearance CLOUDY (*) CLEAR   Specific Gravity, Urine 1.044 (*) 1.005 - 1.030   pH 5.5  5.0 - 8.0   Glucose, UA NEGATIVE  NEGATIVE mg/dL   Hgb urine dipstick LARGE (*) NEGATIVE   Bilirubin Urine SMALL (*) NEGATIVE   Ketones, ur 15 (*) NEGATIVE mg/dL   Protein, ur 045100 (*) NEGATIVE mg/dL   Urobilinogen, UA 0.2  0.0 - 1.0 mg/dL   Nitrite NEGATIVE  NEGATIVE   Leukocytes, UA NEGATIVE  NEGATIVE  URINE MICROSCOPIC-ADD ON      Result Value Ref Range   Squamous Epithelial / LPF FEW (*) RARE   RBC / HPF TOO NUMEROUS TO COUNT  <3 RBC/hpf   Bacteria, UA FEW (*) RARE   Urine-Other MUCOUS PRESENT    LACTIC ACID, PLASMA       Result Value Ref Range   Lactic Acid, Venous 2.2  0.5 - 2.2 mmol/L  MAGNESIUM      Result Value Ref Range   Magnesium 1.8  1.5 - 2.5 mg/dL     MDM   Tannen Kinnie Sloan is a 58 y.o. male with a PMH of hypercholesteremia and dengue fever who presents to the ED for evaluation of allergic reaction.   Rechecks  8:50 AM = Pain uncontrolled. Ordering 4 mg morphine.    Consults  8:45 AM = Spoke with Dr. Annabell HowellsWrenn who would like patient admitted for further evaluation of fevers.  9:15 AM = Spoke with Dr. Janee Mornhompson who will admit.      Patient admitted for further evaluation of intermittent fevers since a prostate biopsy 6 days ago. Patient afebrile and non-toxic in appearance in the ED. No leukocytosis. UA not suggestive of a UTI. Urine sent for culture. Blood cultures drawn in the ED. Per urology, would like patient started on Vancomycin and Primaxin. Rash likely due to a drug reaction with recent antibiotic use. No evidence of anaphylaxis. Vital signs stable. Patient also found to have hematuria likely from recent prostate biopsy. Patient evaluated by urology in the ED. Patient in agreement with admission and plan.   Final impressions: 1. Fever   2. Allergic reaction   3. Rash   4. Hematuria      Greer EeJessica Katlin Willia Lampert PA-C           Jillyn LedgerJessica K Jozee Hammer, PA-C 11/14/13 1237

## 2013-11-14 NOTE — H&P (Signed)
[facility] History and Physical  Stephen Sloan ZOX:[id number] DOB: [birth year] DOA: 11/14/2013  Referring physician: Dr. Jodi MourningZavitz PCP: Lenora BoysFRIED, [name], MD   Chief Complaint: Rash and fever  HPI: Stephen CorriganCletis Sloan is a 58 y.o. male  With history of hernia repair as an infant, recent prostate biopsy per Dr. Berneice HeinrichManny 11/09/2013 who presents to the ED with diffuse rash and fever. Patient states he received 3 days of Bactrim prior to his prostate biopsy and 2 more days of Bactrim post biopsy. Patient also received gentamicin for the biopsy. 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 Patient 2 days post biopsy developed a fever of 100.9 and spoke with urology MD on call who started patient on Levaquin 500 mg daily. Levaquin was intended as a daily dose however states was confusing and stated take 1 daily with meals so patient had been taking Levaquin 3 times daily. One day prior to admission patient stated he had a fever of 101.5 was seen in the urology office at which point in time urinalysis which was done was unremarkable except for microhematuria patient received a dose of IM Rocephin and was to followup on Monday however patient continued to spike fevers a one-to-one 0.5 and developed a diffuse maculopapular rash with no improvement with Benadryl and cortisone cream subsequently presented to the ED. Patient does endorse some chills and clamminess, some shortness of breath on exertion, generalized weakness with aching joints and some suprapubic tenderness and lower back pain. Patient denies any chest pain, no nausea, no vomiting, no diarrhea, no constipation, no cough. Patient also states rash is itching. Patient was seen in the emergency room urinalysis which was done was positive for microhematuria however unremarkable. Comprehensive metabolic profile done and a sodium of 134 otherwise was unremarkable. CBC done was unremarkable. Patient was seen in consultation by urology,  we were called to admit the patient for further evaluation and management.   Review of Systems: As per history of present illness otherwise negative. Constitutional:  No weight loss, night sweats, Fevers, chills, fatigue.  HEENT:  No headaches, Difficulty swallowing,Tooth/dental problems,Sore throat,  No sneezing, itching, ear ache, nasal congestion, post nasal drip,  Cardio-vascular:  No chest pain, Orthopnea, PND, swelling in lower extremities, anasarca, dizziness, palpitations  GI:  No heartburn, indigestion, abdominal pain, nausea, vomiting, diarrhea, change in bowel habits, loss of appetite  Resp:  No shortness of breath with exertion or at rest. No excess mucus, no productive cough, No non-productive cough, No coughing up of blood.No change in color of mucus.No wheezing.No chest wall deformity  Skin:  no rash or lesions.  GU:  no dysuria, change in color of urine, no urgency or frequency. No flank pain.  Musculoskeletal:  No joint pain or swelling. No decreased range of motion. No back pain.  Psych:  No change in mood or affect. No depression or anxiety. No memory loss.   Past Medical History  Diagnosis Date  . Dengue fever   . Hypercholesteremia    Past Surgical History  Procedure Laterality Date  . Hernia repair    . Prostate biopsy     Social History:  reports that he has never smoked. He does not have any smokeless tobacco history on file. He reports that he does not drink alcohol or use illicit drugs.  No Known Allergies  History reviewed. No pertinent family history.   Prior to Admission medications   Medication Sig Start Date End Date Taking? Authorizing Provider  acetaminophen (TYLENOL) 325 MG tablet Take 650 mg by mouth every 6 (six) hours as needed for mild pain.   Yes Historical Provider, MD  CefTRIAXone Sodium (ROCEPHIN IJ) Inject 1 mL as directed once.   Yes Historical Provider, MD  diphenhydrAMINE (BENADRYL) 25 mg capsule Take 25 mg by mouth every 6  (six) hours as needed for itching.   Yes Historical Provider, MD  hydrocortisone cream 1 % Apply 1 application topically 2 (two) times daily.   Yes Historical Provider, MD  levofloxacin (LEVAQUIN) 500 MG tablet Take 500 mg by mouth daily.   Yes Historical Provider, MD   Physical Exam: Filed Vitals:   11/14/13 0858  BP: 126/74  Pulse: 75  Temp: 98.5 F (36.9 C)  Resp: 16    BP 126/74  Pulse 75  Temp(Src) 98.5 F (36.9 C) (Oral)  Resp 16  Ht 6' (1.829 m)  Wt 92.987 kg (205 lb)  BMI 27.80 kg/m2  SpO2 99%  General:  Appears calm and comfortable. Diffuse maculopapular rash on torso and lower extremities. Eyes: PERRLA, EOMI, normal lids, irises & conjunctiva ENT: grossly normal hearing, lips & tongue Neck: no LAD, masses or thyromegaly Cardiovascular: RRR, no m/r/g. No LE edema. Respiratory: CTA bilaterally, no w/r/r. Normal respiratory effort. Abdomen: soft, ntnd, positive bowel sounds, no rebound, no guarding Skin: Diffuse blanchable maculopapular rash on torso and lower extremities. Musculoskeletal: grossly normal tone BUE/BLE Psychiatric: grossly normal mood and affect, speech fluent and appropriate Neurologic: Alert and oriented x3. Cranial nerves II through XII are grossly intact. No focal deficits.           Labs on Admission:  Basic Metabolic Panel:  Recent Labs Lab 11/14/13 0810  NA 134*  K 4.1  CL 98  CO2 22  GLUCOSE 105*  BUN 20  CREATININE 1.10  CALCIUM 8.9   Liver Function Tests:  Recent Labs Lab 11/14/13 0810  AST 34  ALT 23  ALKPHOS 79  BILITOT 0.7  PROT 7.3  ALBUMIN 3.6   No results found for this basename: LIPASE, AMYLASE,  in the last 168 hours No results found for this basename: AMMONIA,  in the last 168 hours CBC:  Recent Labs Lab 11/14/13 0810  WBC 4.9  NEUTROABS 3.0  HGB 15.3  HCT 42.8  MCV 86.6  PLT 146*   Cardiac Enzymes: No results found for this basename: CKTOTAL, CKMB, CKMBINDEX, TROPONINI,  in the last 168  hours  BNP (last 3 results) No results found for this basename: PROBNP,  in the last 8760 hours CBG: No results found for this basename: GLUCAP,  in the last 168 hours  Radiological Exams on Admission: Dg Chest 2 View  11/14/2013   CLINICAL DATA:  Fever, rash, recent prostate biopsy  EXAM: CHEST  2 VIEW  COMPARISON:  None.  FINDINGS: Vague patchy opacity overlying the right mid lung, equivocal and favored to reflect prominent pulmonary markings, although pneumonia is not excluded. No pleural effusion or pneumothorax.  The heart is normal in size.  Degenerative changes of the visualized thoracolumbar spine.  IMPRESSION: Vague patchy opacity overlying the right mid lung, equivocal, pneumonia not excluded.   Electronically Signed   By: Charline BillsSriyesh  Krishnan M.D.   On: 11/14/2013 10:27    EKG: Independently reviewed. Not done  Assessment/Plan Principal Problem:   Allergic drug rash Active Problems:   Prostatitis, acute   Maculopapular rash, generalized   Fever   Dehydration   Allergic drug reaction  #1 allergic drug reaction Patient  presenting with fevers, diffuse maculopapular rash, after receiving multiple antibiotics of Bactrim, gentamicin, Levaquin, IM Rocephin. Urinalysis which was done was negative. Chest x-ray has not been done and a such will order that. Blood cultures have been ordered and are pending. Will check a CT of the abdomen and pelvis to rule out a prostate abscess. Will discontinue all antibiotics at this time as per discussion with Dr. Ninetta Lights of infectious diseases. Will place on IV Solu-Medrol, IV Benadryl, IV Pepcid, IV fluids, supportive care. Follow.  #2 fever Likely secondary to problem #1. Urinalysis which was done was unremarkable except some microhematuria. Urine cultures are pending. Urinalysis which was done per urology note in the office was also unremarkable. Urine cultures from the office and not at my disposable at this time. Patient currently in the emergency  room and is afebrile. Blood cultures are been ordered and are pending. Chest x-ray was not done and a such will get a chest x-ray. Will also get a CT of the abdomen and pelvis to rule out a prostate abscess. Will discontinue all antibiotics at this time as per discussion with Dr. Ninetta Lights of infectious diseases until we have a source for his fevers. Will follow for now.  #3 maculopapular rash Likely secondary to problem #2. See problem #2.  #4 dehydration IV fluids.  #5 status post recent prostate biopsy Per urology. Dr Annabell Howells is ff.  #6 prophylaxis Pepcid for GI prophylaxis, Lovenox for DVT prophylaxis.   Code Status: Full Family Communication: Updated patient and wife at bedside. Disposition Plan: Admit to MedSurg  Time spent: 65 minutes  Rodolph Bong M.D. [facility] Pager [phone]

## 2013-11-14 NOTE — ED Notes (Signed)
Unsuccessful lab draw by mini lab. Main lab contacted and verbalized will come to attempt lab draw.

## 2013-11-14 NOTE — Consult Note (Signed)
Subjective: I was asked to see [name] in the ER by Dr. Jodi MourningZavitz for a rash and fever related to a recent prostate biopsy and treatment for a post biopsy fever.   He was biopsied by Dr. 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   He developed a fever and spoke to the on call doctor on 4/23 and was started on Levaquin 500mg .   This was intended as a daily dose but the sig was confusing and said take 1 daily with meals so he has been taking the levaquin TID.   He was seen back the office yesterday for persistent fever and was given a gram of rocephin.  His WBC was normal and his UA just had microhematuria.  He was to follow up Monday but continued to have fever overnight to 101.5 and has develped a diffuse maculopapular rash.   He is voiding without complaints but has some suprapubic and low back discomfort.   ROS:  Review of Systems  Constitutional: Positive for fever and malaise/fatigue.  Respiratory: Negative for cough and shortness of breath.   Cardiovascular: Negative for chest pain.  Gastrointestinal: Positive for abdominal pain (suprapubic).  Genitourinary: Negative for dysuria, urgency and hematuria.  Musculoskeletal: Positive for myalgias and neck pain.  Skin: Positive for itching and rash.  All other systems reviewed and are negative.   No Known Allergies  Past Medical History  Diagnosis Date  . Dengue fever   . Hypercholesteremia     Past Surgical History  Procedure Laterality Date  . Hernia repair    . Prostate biopsy      History   Social History  . Marital Status: Married    Spouse Name: N/A    Number of Children: N/A  . Years of Education: N/A   Occupational History  . Not on file.   Social History Main Topics  . Smoking status: Never Smoker   . Smokeless tobacco: Not on file  . Alcohol Use: No  . Drug Use: No  . Sexual Activity: Not on file   Other  Topics Concern  . Not on file   Social History Narrative  . No narrative on file    No family history on file.  Anti-infectives: Anti-infectives   None      Current Facility-Administered Medications  Medication Dose Route Frequency Provider Last Rate Last Dose  . diphenhydrAMINE (BENADRYL) 50 MG/ML injection            Current Outpatient Prescriptions  Medication Sig Dispense Refill  . acetaminophen (TYLENOL) 325 MG tablet Take 650 mg by mouth every 6 (six) hours as needed for mild pain.      . CefTRIAXone Sodium (ROCEPHIN IJ) Inject 1 mL as directed once.      . diphenhydrAMINE (BENADRYL) 25 mg capsule Take 25 mg by mouth every 6 (six) hours as needed for itching.      . hydrocortisone cream 1 % Apply 1 application topically 2 (two) times daily.      Marland Kitchen. levofloxacin (LEVAQUIN) 500 MG tablet Take 500 mg by mouth daily.         Objective: Vital signs in last 24 hours: Temp:  [97.8 F (36.6 C)-98.2 F (36.8 C)] 98.2 F (36.8 C) (04/25 0706) Pulse Rate:  [72-107] 72 (04/25 0736) Resp:  [20] 20 (04/25 0706) BP: (102-117)/(68-78) 102/78 mmHg (04/25 0736) SpO2:  [  94 %-100 %] 99 % (04/25 0736) Weight:  [92.987 kg (205 lb)] 92.987 kg (205 lb) (04/25 0547)  Intake/Output from previous day:   Intake/Output this shift:     Physical Exam  Constitutional: He is oriented to person, place, and time and well-developed, well-nourished, and in no distress.  HENT:  Head: Normocephalic and atraumatic.  Neck: Normal range of motion. Neck supple.  Cardiovascular: Normal rate, regular rhythm and normal heart sounds.   Pulmonary/Chest: Effort normal and breath sounds normal. No respiratory distress.  Abdominal: Soft. Bowel sounds are normal. He exhibits no distension. There is tenderness (mild suprapubic).  Musculoskeletal: Normal range of motion. He exhibits no edema and no tenderness.  Lymphadenopathy:    He has no cervical adenopathy.  Neurological: He is alert and oriented to  person, place, and time.  Skin: Skin is warm. Rash (diffuse maculopapular ) noted. There is erythema.  Psychiatric: Mood and affect normal.    Lab Results:   Recent Labs  11/14/13 0810  WBC 4.9  HGB 15.3  HCT 42.8  PLT 146*   BMET No results found for this basename: NA, K, CL, CO2, GLUCOSE, BUN, CREATININE, CALCIUM,  in the last 72 hours PT/INR No results found for this basename: LABPROT, INR,  in the last 72 hours ABG No results found for this basename: PHART, PCO2, PO2, HCO3,  in the last 72 hours  Studies/Results: No results found.   Assessment: He has a post prostate biopsy fever and a maculopapular rash that is probably related to one of the 4 antibiotics he was given. He has taken an excess dose of the levaquin.  Per pharmacy the rate of hypersensitivity Levaquin is 2% but no other significant toxicities are likely.   Levaquin can prolong QT.   Pharmacy also felt that with his normal renal function he has cleared the drug.   Plan: He needs to be admitted but with the rash I feel the hospitalist service would be most appropriate.    I have discussed appropriate antibiotic coverage for the post biopsy fever with Pharmacy and it was felt that Primaxin and Vancomycin would be the best option.  I might be worthwhile to consider a probiotic such as florastor to help his gut flora in the face of the multiple antibiotics.   CC: Dr. Cephus RicherJosh Zavitz    LOS: 0 days    Stephen PippinJohn Isham Sloan 11/14/2013

## 2013-11-15 LAB — CBC WITH DIFFERENTIAL/PLATELET
BASOS PCT: 1 % (ref 0–1)
Basophils Absolute: 0 10*3/uL (ref 0.0–0.1)
Eosinophils Absolute: 0 10*3/uL (ref 0.0–0.7)
Eosinophils Relative: 0 % (ref 0–5)
HCT: 39.1 % (ref 39.0–52.0)
HEMOGLOBIN: 13.5 g/dL (ref 13.0–17.0)
LYMPHS PCT: 24 % (ref 12–46)
Lymphs Abs: 2 10*3/uL (ref 0.7–4.0)
MCH: 30.3 pg (ref 26.0–34.0)
MCHC: 34.5 g/dL (ref 30.0–36.0)
MCV: 87.7 fL (ref 78.0–100.0)
MONOS PCT: 6 % (ref 3–12)
Monocytes Absolute: 0.4 10*3/uL (ref 0.1–1.0)
NEUTROS ABS: 5.6 10*3/uL (ref 1.7–7.7)
NEUTROS PCT: 70 % (ref 43–77)
Platelets: 166 10*3/uL (ref 150–400)
RBC: 4.46 MIL/uL (ref 4.22–5.81)
RDW: 12.2 % (ref 11.5–15.5)
WBC: 8.1 10*3/uL (ref 4.0–10.5)

## 2013-11-15 LAB — BASIC METABOLIC PANEL
BUN: 18 mg/dL (ref 6–23)
CO2: 21 mEq/L (ref 19–32)
Calcium: 8.6 mg/dL (ref 8.4–10.5)
Chloride: 104 mEq/L (ref 96–112)
Creatinine, Ser: 0.85 mg/dL (ref 0.50–1.35)
Glucose, Bld: 158 mg/dL — ABNORMAL HIGH (ref 70–99)
POTASSIUM: 4.1 meq/L (ref 3.7–5.3)
Sodium: 139 mEq/L (ref 137–147)

## 2013-11-15 LAB — URINE CULTURE
COLONY COUNT: NO GROWTH
CULTURE: NO GROWTH

## 2013-11-15 MED ORDER — METHYLPREDNISOLONE SODIUM SUCC 125 MG IJ SOLR
125.0000 mg | Freq: Two times a day (BID) | INTRAMUSCULAR | Status: DC
  Administered 2013-11-15 – 2013-11-16 (×2): 125 mg via INTRAVENOUS
  Filled 2013-11-15 (×4): qty 2

## 2013-11-15 MED ORDER — DIPHENHYDRAMINE HCL 50 MG/ML IJ SOLN
25.0000 mg | Freq: Two times a day (BID) | INTRAMUSCULAR | Status: DC
  Administered 2013-11-15: 25 mg via INTRAVENOUS
  Filled 2013-11-15: qty 1
  Filled 2013-11-15 (×2): qty 0.5

## 2013-11-15 MED ORDER — FAMOTIDINE 20 MG PO TABS
20.0000 mg | ORAL_TABLET | Freq: Two times a day (BID) | ORAL | Status: DC
  Administered 2013-11-15 – 2013-11-16 (×2): 20 mg via ORAL
  Filled 2013-11-15 (×3): qty 1

## 2013-11-15 NOTE — Progress Notes (Signed)
[facility] PROGRESS NOTE  Stephen Sloan NWG:[id number] DOB: 08-Mar-1956 DOA: 11/14/2013 PCP: Lenora BoysFRIED, [name], MD  Assessment/Plan: #1. Allergic Drug Reaction Likely drug-induced secondary to either Bactrim, gentamicin, Levaquin or IM Rocephin. Patient currently afebrile. Clinical improvement with maculopapular rash. Cultures have been ordered and are pending with no growth to date. Continue steroid taper, change IV Benadryl to oral Benadryl taper, change IV Pepcid to oral Pepcid and taper. Continue supportive care. Appreciate ID input and recommendations.  #2 fever Likely secondary to problem #1. Patient has been pancultured and cultures are pending. Clinical improvement. Patient currently off antibiotics and no need for antibiotics at this time. ID following and appreciate input and recommendations.  #3 maculopapular rash Likely secondary to problem #1. Clinical improvement. Continue steroid taper, Pepcid taper, Benadryl taper. ID following and appreciate input and recommendations.  #4 dehydration Improved. IV fluids.  #5 status post recent prostate biopsy Per urology. Urology following.  #6 prophylaxis Pepcid for GI prophylaxis Lovenox for DVT prophylaxis.  Code Status: Full Family Communication: Updated patient and wife at bedside. Disposition Plan: Home when medically stable.   Consultants:  Urology: Dr. Annabell HowellsWrenn 11/14/2013  Infectious diseases: Dr. Ninetta LightsHatcher 11/14/2013  Procedures:  CT abdomen and pelvis 11/14/2013  Antibiotics:  None  HPI/Subjective: Patient states he's feeling better. Patient states itching has resolved. Patient states some improvement in his rash.  Objective: Filed Vitals:   11/15/13 0457  BP: 115/64  Pulse: 71  Temp: 98.5 F (36.9 C)  Resp: 18    Intake/Output Summary (Last 24 hours) at 11/15/13 1203 Last data filed at 11/15/13 0545  Gross per 24 hour  Intake 2803.16 ml  Output   2200 ml  Net 603.16 ml   Filed Weights   11/14/13  0547 11/14/13 0942 11/15/13 0457  Weight: 92.987 kg (205 lb) 92.987 kg (205 lb) 93.033 kg (205 lb 1.6 oz)    Exam:   General:  Improved diffuse maculopapular rash. No apparent distress.  Cardiovascular: Regular rhythm no murmurs rubs or gallops  Respiratory: Clear to auscultation bilaterally.  Abdomen: Soft, nontender, nondistended, positive bowel sounds.  Musculoskeletal: No clubbing cyanosis or edema.  Data Reviewed: Basic Metabolic Panel:  Recent Labs Lab 11/14/13 0810 11/15/13 0527  NA 134* 139  K 4.1 4.1  CL 98 104  CO2 22 21  GLUCOSE 105* 158*  BUN 20 18  CREATININE 1.10 0.85  CALCIUM 8.9 8.6  MG 1.8  --    Liver Function Tests:  Recent Labs Lab 11/14/13 0810  AST 34  ALT 23  ALKPHOS 79  BILITOT 0.7  PROT 7.3  ALBUMIN 3.6   No results found for this basename: LIPASE, AMYLASE,  in the last 168 hours No results found for this basename: AMMONIA,  in the last 168 hours CBC:  Recent Labs Lab 11/14/13 0810 11/15/13 0527  WBC 4.9 8.1  NEUTROABS 3.0 5.6  HGB 15.3 13.5  HCT 42.8 39.1  MCV 86.6 87.7  PLT 146* 166   Cardiac Enzymes: No results found for this basename: CKTOTAL, CKMB, CKMBINDEX, TROPONINI,  in the last 168 hours BNP (last 3 results) No results found for this basename: PROBNP,  in the last 8760 hours CBG: No results found for this basename: GLUCAP,  in the last 168 hours  No results found for this or any previous visit (from the past 240 hour(s)).   Studies: Dg Chest 2 View  11/14/2013   CLINICAL DATA:  Fever, rash, recent prostate biopsy  EXAM: CHEST  2 VIEW  COMPARISON:  None.  FINDINGS: Vague patchy opacity overlying the right mid lung, equivocal and favored to reflect prominent pulmonary markings, although pneumonia is not excluded. No pleural effusion or pneumothorax.  The heart is normal in size.  Degenerative changes of the visualized thoracolumbar spine.  IMPRESSION: Vague patchy opacity overlying the right mid lung, equivocal,  pneumonia not excluded.   Electronically Signed   By: Charline BillsSriyesh  Krishnan M.D.   On: 11/14/2013 10:27   Ct Abdomen Pelvis W Contrast  11/14/2013   CLINICAL DATA:  Fever and rash.  Recent prostate biopsy  EXAM: CT ABDOMEN AND PELVIS WITH CONTRAST  TECHNIQUE: Multidetector CT imaging of the abdomen and pelvis was performed using the standard protocol following bolus administration of intravenous contrast.  CONTRAST:  50mL OMNIPAQUE IOHEXOL 300 MG/ML SOLN, 100mL OMNIPAQUE IOHEXOL 300 MG/ML SOLN  COMPARISON:  None.  FINDINGS: Lung bases are clear.  No pericardial fluid.  No focal hepatic lesion. The gallbladder, pancreas, spleen, adrenal glands, and kidneys are normal.  The stomach, small bowel, and cecum are normal. Colon and rectosigmoid colon are normal.  Abdominal aorta is normal caliber. No retroperitoneal periportal lymphadenopathy. No free fluid in the pelvis. The prostate gland is normal. No evidence of prostatic inflammation. No fluid in the pelvis. The bladder likewise appears normal. No pelvic lymphadenopathy. No aggressive osseous lesion.  IMPRESSION: 1. No CT evidence of prostate infection or pelvic infection. 2. No evidence of infection or inflammation in the abdomen.   Electronically Signed   By: Genevive BiStewart  Edmunds M.D.   On: 11/14/2013 14:23    Scheduled Meds: . diphenhydrAMINE  25 mg Intravenous 3 times per day  . docusate sodium  100 mg Oral BID  . enoxaparin (LOVENOX) injection  40 mg Subcutaneous Q24H  . famotidine (PEPCID) IV  20 mg Intravenous Q12H  . methylPREDNISolone (SOLU-MEDROL) injection  125 mg Intravenous 3 times per day   Continuous Infusions: . sodium chloride 0.9 % 1,000 mL infusion 125 mL/hr at 11/14/13 2024    Principal Problem:   Allergic drug rash Active Problems:   Prostatitis, acute   Maculopapular rash, generalized   Fever   Dehydration   Allergic drug reaction   Dengue fever    Time spent: 30 minutes    Rodolph Bonganiel V Thompson MD [facility] Pager  [phone]. If 7PM-7AM, please contact night-coverage at [email or web address], password [email or web address] 11/15/2013, 12:03 PM  LOS: 1 day

## 2013-11-15 NOTE — ED Provider Notes (Signed)
Medical screening examination/treatment/procedure(s) were conducted as a shared visit with non-physician practitioner(s) or resident and myself. I personally evaluated the patient during the encounter and agree with the findings and plan unless otherwise indicated.  I have personally reviewed any xrays and/ or EKG's with the provider and I agree with interpretation.  58 year old male with recent prostate biopsy and recurrent fevers despite Bactrim followed by Levaquin and Rocephin. Patient has had intermittent bodyaches and fevers. No rectal pain or pressure. Patient healthy otherwise. Patient developed diffuse macular rash with mild pruritus. On exam patient has diffuse macular rash with no purpura or petechia, no oral lesions. No stridor breathing difficulties. Abdomen soft nontender, mild paraspinal lumbar tenderness without midline tenderness. Plan for blood work in discussion with urology and likely plan for admission to medicine for further evaluation.  Rash, Body aches, Fever   Enid SkeensJoshua M Dontrel Smethers, MD 11/15/13 0630

## 2013-11-15 NOTE — Progress Notes (Signed)
INFECTIOUS DISEASE PROGRESS NOTE  ID: Stephen Sloan is a 58 y.o. male with  Principal Problem:   Allergic drug rash Active Problems:   Prostatitis, acute   Maculopapular rash, generalized   Fever   Dehydration   Allergic drug reaction   Dengue fever  Subjective: Feels well, continued rash. NO SOB, no dysphagia  Abtx:  Anti-infectives   Start     Dose/Rate Route Frequency Ordered Stop   11/14/13 2200  vancomycin (VANCOCIN) 1,250 mg in sodium chloride 0.9 % 250 mL IVPB  Status:  Discontinued     1,250 mg 166.7 mL/hr over 90 Minutes Intravenous Every 12 hours 11/14/13 1004 11/14/13 1007   11/14/13 1030  vancomycin (VANCOCIN) 2,000 mg in sodium chloride 0.9 % 500 mL IVPB  Status:  Discontinued     2,000 mg 250 mL/hr over 120 Minutes Intravenous  Once 11/14/13 1004 11/14/13 1007   11/14/13 1030  imipenem-cilastatin (PRIMAXIN) 500 mg in sodium chloride 0.9 % 100 mL IVPB  Status:  Discontinued     500 mg 200 mL/hr over 30 Minutes Intravenous 4 times per day 11/14/13 1004 11/14/13 1007      Medications:  Scheduled: . diphenhydrAMINE  25 mg Intravenous 3 times per day  . docusate sodium  100 mg Oral BID  . enoxaparin (LOVENOX) injection  40 mg Subcutaneous Q24H  . famotidine (PEPCID) IV  20 mg Intravenous Q12H  . methylPREDNISolone (SOLU-MEDROL) injection  125 mg Intravenous 3 times per day    Objective: Vital signs in last 24 hours: Temp:  [98.3 F (36.8 Sloan)-98.5 F (36.9 Sloan)] 98.5 F (36.9 Sloan) (04/26 0457) Pulse Rate:  [71-73] 71 (04/26 0457) Resp:  [18] 18 (04/26 0457) BP: (107-115)/(63-64) 115/64 mmHg (04/26 0457) SpO2:  [98 %-99 %] 98 % (04/26 0457) Weight:  [93.033 kg (205 lb 1.6 oz)] 93.033 kg (205 lb 1.6 oz) (04/26 0457)   General appearance: alert, cooperative and no distress Throat: lips, mucosa, and tongue normal; teeth and gums normal Resp: clear to auscultation bilaterally Cardio: regular rate and rhythm GI: normal findings: bowel sounds normal and soft,  non-tender  Lab Results  Recent Labs  11/14/13 0810 11/15/13 0527  WBC 4.9 8.1  HGB 15.3 13.5  HCT 42.8 39.1  NA 134* 139  K 4.1 4.1  CL 98 104  CO2 22 21  BUN 20 18  CREATININE 1.10 0.85   Liver Panel  Recent Labs  11/14/13 0810  PROT 7.3  ALBUMIN 3.6  AST 34  ALT 23  ALKPHOS 79  BILITOT 0.7   Sedimentation Rate No results found for this basename: ESRSEDRATE,  in the last 72 hours Sloan-Reactive Protein No results found for this basename: CRP,  in the last 72 hours  Microbiology: No results found for this or any previous visit (from the past 240 hour(s)).  Studies/Results: Dg Chest 2 View  11/14/2013   CLINICAL DATA:  Fever, rash, recent prostate biopsy  EXAM: CHEST  2 VIEW  COMPARISON:  None.  FINDINGS: Vague patchy opacity overlying the right mid lung, equivocal and favored to reflect prominent pulmonary markings, although pneumonia is not excluded. No pleural effusion or pneumothorax.  The heart is normal in size.  Degenerative changes of the visualized thoracolumbar spine.  IMPRESSION: Vague patchy opacity overlying the right mid lung, equivocal, pneumonia not excluded.   Electronically Signed   By: Charline BillsSriyesh  Krishnan M.D.   On: 11/14/2013 10:27   Ct Abdomen Pelvis W Contrast  11/14/2013   CLINICAL DATA:  Fever and rash.  Recent prostate biopsy  EXAM: CT ABDOMEN AND PELVIS WITH CONTRAST  TECHNIQUE: Multidetector CT imaging of the abdomen and pelvis was performed using the standard protocol following bolus administration of intravenous contrast.  CONTRAST:  50mL OMNIPAQUE IOHEXOL 300 MG/ML SOLN, 100mL OMNIPAQUE IOHEXOL 300 MG/ML SOLN  COMPARISON:  None.  FINDINGS: Lung bases are clear.  No pericardial fluid.  No focal hepatic lesion. The gallbladder, pancreas, spleen, adrenal glands, and kidneys are normal.  The stomach, small bowel, and cecum are normal. Colon and rectosigmoid colon are normal.  Abdominal aorta is normal caliber. No retroperitoneal periportal  lymphadenopathy. No free fluid in the pelvis. The prostate gland is normal. No evidence of prostatic inflammation. No fluid in the pelvis. The bladder likewise appears normal. No pelvic lymphadenopathy. No aggressive osseous lesion.  IMPRESSION: 1. No CT evidence of prostate infection or pelvic infection. 2. No evidence of infection or inflammation in the abdomen.   Electronically Signed   By: Genevive BiStewart  Edmunds M.D.   On: 11/14/2013 14:23     Assessment/Plan: Adverse Drug Reaction Fever S/p Prostate Bx  Total days of antibiotics: off x 24h  Would anticipate d/Sloan soon with prednisone taper, H2 blocker He appears improved but still has significant rash.  Will need f/u UCx, BCx when he f/u's with MD.          Stephen SmartJeffrey Sloan Stephen Sloan Infectious Diseases (pager) [phone] [email or web address] 11/15/2013, 11:21 AM  LOS: 1 day   **Disclaimer: This note may have been dictated with voice recognition software. Similar sounding words can inadvertently be transcribed and this note may contain transcription errors which may not have been corrected upon publication of note.**

## 2013-11-15 NOTE — Progress Notes (Signed)
Patient ID: Stephen CorriganCletis Sloan, male   DOB: 1956-05-08, 58 y.o.   MRN: [id number]    Subjective: Stephen Sloan feels better today.  The rash has improved and the itching has resolved.   He has had no further fever and he is voiding well.  The CT scan showed no prostate or intraabdominal abscesses.  His bladder was full on CT but he reports no voiding symptoms.  He was not placed back on antibiotics per ID recommendation.  ROS:  Review of Systems  Constitutional: Negative for fever.  Gastrointestinal: Negative for abdominal pain.  Genitourinary: Negative.   Skin: Positive for rash. Negative for itching.    Anti-infectives: Anti-infectives   Start     Dose/Rate Route Frequency Ordered Stop   11/14/13 2200  vancomycin (VANCOCIN) 1,250 mg in sodium chloride 0.9 % 250 mL IVPB  Status:  Discontinued     1,250 mg 166.7 mL/hr over 90 Minutes Intravenous Every 12 hours 11/14/13 1004 11/14/13 1007   11/14/13 1030  vancomycin (VANCOCIN) 2,000 mg in sodium chloride 0.9 % 500 mL IVPB  Status:  Discontinued     2,000 mg 250 mL/hr over 120 Minutes Intravenous  Once 11/14/13 1004 11/14/13 1007   11/14/13 1030  imipenem-cilastatin (PRIMAXIN) 500 mg in sodium chloride 0.9 % 100 mL IVPB  Status:  Discontinued     500 mg 200 mL/hr over 30 Minutes Intravenous 4 times per day 11/14/13 1004 11/14/13 1007      Current Facility-Administered Medications  Medication Dose Route Frequency Provider Last Rate Last Dose  . acetaminophen (TYLENOL) tablet 650 mg  650 mg Oral Q6H PRN Rodolph Bonganiel V Thompson, MD       Or  . acetaminophen (TYLENOL) suppository 650 mg  650 mg Rectal Q6H PRN Rodolph Bonganiel V Thompson, MD      . albuterol (PROVENTIL) (2.5 MG/3ML) 0.083% nebulizer solution 2.5 mg  2.5 mg Nebulization Q2H PRN Rodolph Bonganiel V Thompson, MD      . diphenhydrAMINE (BENADRYL) injection 25 mg  25 mg Intravenous 3 times per day Rodolph Bonganiel V Thompson, MD   25 mg at 11/15/13 0545  . docusate sodium (COLACE) capsule 100 mg  100 mg Oral BID Rodolph Bonganiel V  Thompson, MD   100 mg at 11/14/13 1236  . enoxaparin (LOVENOX) injection 40 mg  40 mg Subcutaneous Q24H Rodolph Bonganiel V Thompson, MD   40 mg at 11/14/13 1235  . famotidine (PEPCID) IVPB 20 mg  20 mg Intravenous Q12H Rodolph Bonganiel V Thompson, MD 100 mL/hr at 11/14/13 2220 20 mg at 11/14/13 2220  . hydrOXYzine (ATARAX/VISTARIL) tablet 25 mg  25 mg Oral TID PRN Rodolph Bonganiel V Thompson, MD      . ipratropium (ATROVENT) nebulizer solution 0.5 mg  0.5 mg Nebulization Q2H PRN Rodolph Bonganiel V Thompson, MD      . methylPREDNISolone sodium succinate (SOLU-MEDROL) 125 mg/2 mL injection 125 mg  125 mg Intravenous 3 times per day Rodolph Bonganiel V Thompson, MD   125 mg at 11/15/13 0544  . morphine 2 MG/ML injection 1-2 mg  1-2 mg Intravenous Q4H PRN Rodolph Bonganiel V Thompson, MD      . ondansetron [facility](ZOFRAN) tablet 4 mg  4 mg Oral Q6H PRN Rodolph Bonganiel V Thompson, MD       Or  . ondansetron [facility](ZOFRAN) injection 4 mg  4 mg Intravenous Q6H PRN Rodolph Bonganiel V Thompson, MD      . oxyCODONE (Oxy IR/ROXICODONE) immediate release tablet 5 mg  5 mg Oral Q4H PRN Rodolph Bonganiel V Thompson, MD      .  polyethylene glycol (MIRALAX / GLYCOLAX) packet 17 g  17 g Oral Daily PRN Rodolph Bonganiel V Thompson, MD      . sodium chloride 0.9 % 1,000 mL infusion   Intravenous Continuous Rodolph Bonganiel V Thompson, MD 125 mL/hr at 11/14/13 2024    . sorbitol 70 % solution 30 mL  30 mL Oral Daily PRN Rodolph Bonganiel V Thompson, MD         Objective: Vital signs in last 24 hours: Temp:  [98.3 F (36.8 C)-98.5 F (36.9 C)] 98.5 F (36.9 C) (04/26 0457) Pulse Rate:  [71-78] 71 (04/26 0457) Resp:  [18] 18 (04/26 0457) BP: (107-121)/(63-70) 115/64 mmHg (04/26 0457) SpO2:  [98 %-100 %] 98 % (04/26 0457) Weight:  [92.987 kg (205 lb)-93.033 kg (205 lb 1.6 oz)] 93.033 kg (205 lb 1.6 oz) (04/26 0457)  Intake/Output from previous day: 04/25 0701 - 04/26 0700 In: 2853.2 [P.O.:420; I.V.:2383.2; IV Piggyback:50] Out: 2200 [Urine:2200] Intake/Output this shift:     Physical Exam  Constitutional: He is well-developed,  well-nourished, and in no distress. No distress.  Abdominal: Soft. He exhibits no mass. There is no tenderness.  Skin: Rash noted.    Lab Results:   Recent Labs  11/14/13 0810 11/15/13 0527  WBC 4.9 8.1  HGB 15.3 13.5  HCT 42.8 39.1  PLT 146* 166   BMET  Recent Labs  11/14/13 0810 11/15/13 0527  NA 134* 139  K 4.1 4.1  CL 98 104  CO2 22 21  GLUCOSE 105* 158*  BUN 20 18  CREATININE 1.10 0.85  CALCIUM 8.9 8.6   PT/INR No results found for this basename: LABPROT, INR,  in the last 72 hours ABG No results found for this basename: PHART, PCO2, PO2, HCO3,  in the last 72 hours  Studies/Results: Dg Chest 2 View  11/14/2013   CLINICAL DATA:  Fever, rash, recent prostate biopsy  EXAM: CHEST  2 VIEW  COMPARISON:  None.  FINDINGS: Vague patchy opacity overlying the right mid lung, equivocal and favored to reflect prominent pulmonary markings, although pneumonia is not excluded. No pleural effusion or pneumothorax.  The heart is normal in size.  Degenerative changes of the visualized thoracolumbar spine.  IMPRESSION: Vague patchy opacity overlying the right mid lung, equivocal, pneumonia not excluded.   Electronically Signed   By: Charline BillsSriyesh  Krishnan M.D.   On: 11/14/2013 10:27   Ct Abdomen Pelvis W Contrast  11/14/2013   CLINICAL DATA:  Fever and rash.  Recent prostate biopsy  EXAM: CT ABDOMEN AND PELVIS WITH CONTRAST  TECHNIQUE: Multidetector CT imaging of the abdomen and pelvis was performed using the standard protocol following bolus administration of intravenous contrast.  CONTRAST:  50mL OMNIPAQUE IOHEXOL 300 MG/ML SOLN, 100mL OMNIPAQUE IOHEXOL 300 MG/ML SOLN  COMPARISON:  None.  FINDINGS: Lung bases are clear.  No pericardial fluid.  No focal hepatic lesion. The gallbladder, pancreas, spleen, adrenal glands, and kidneys are normal.  The stomach, small bowel, and cecum are normal. Colon and rectosigmoid colon are normal.  Abdominal aorta is normal caliber. No retroperitoneal  periportal lymphadenopathy. No free fluid in the pelvis. The prostate gland is normal. No evidence of prostatic inflammation. No fluid in the pelvis. The bladder likewise appears normal. No pelvic lymphadenopathy. No aggressive osseous lesion.  IMPRESSION: 1. No CT evidence of prostate infection or pelvic infection. 2. No evidence of infection or inflammation in the abdomen.   Electronically Signed   By: Genevive BiStewart  Edmunds M.D.   On: 11/14/2013 14:23   I  have reviewed his labs and CT films and report.   Assessment: Antibiotic rash improving with steroids. He remains afebrile off of antibiotics and is voiding well.   Plan: D/C per hospitalists.   He has f/u with Dr. Berneice Heinrich tomorrow if he has been discharged.      LOS: 1 day    Bjorn Pippin 11/15/2013

## 2013-11-16 MED ORDER — DIPHENHYDRAMINE HCL 25 MG PO CAPS
25.0000 mg | ORAL_CAPSULE | Freq: Two times a day (BID) | ORAL | Status: DC
  Administered 2013-11-16: 25 mg via ORAL
  Filled 2013-11-16: qty 1

## 2013-11-16 MED ORDER — PREDNISONE 50 MG PO TABS: 60.0000 mg | ORAL_TABLET | Freq: Every day | ORAL | Status: DC

## 2013-11-16 MED ORDER — PREDNISONE 50 MG PO TABS
60.0000 mg | ORAL_TABLET | Freq: Two times a day (BID) | ORAL | Status: DC
  Filled 2013-11-16 (×2): qty 1

## 2013-11-16 MED ORDER — DIPHENHYDRAMINE HCL 25 MG PO CAPS: 25.0000 mg | ORAL_CAPSULE | Freq: Two times a day (BID) | ORAL | Status: DC

## 2013-11-16 MED ORDER — FAMOTIDINE 20 MG PO TABS: 20.0000 mg | ORAL_TABLET | Freq: Two times a day (BID) | ORAL | Status: DC

## 2013-11-16 MED ORDER — PREDNISONE 20 MG PO TABS: 60.0000 mg | ORAL_TABLET | Freq: Two times a day (BID) | ORAL | Status: DC

## 2013-11-16 NOTE — Progress Notes (Addendum)
Patient discharged home, all discharge medications and instructions reviewed and questions answered.  Patient refused wheelchair assistance to vehicle states will ambulate.  

## 2013-11-16 NOTE — Progress Notes (Signed)
Subjective:  1 - Fever After Prostate Biopsy - Pt with fever >48 hours after prostate biopsy. UCX from office negative, now afebrile. Received Getn and fluroquinolone peri-procedure. Path from biopsy atypical in 1 core and discussed previously.   Today Stephen Sloan is w/o complaints. Rash resolved and feeling good. Afebrile.   Objective: Vital signs in last 24 hours: Temp:  [98.2 F (36.8 C)-98.3 F (36.8 C)] 98.2 F (36.8 C) (04/27 0529) Pulse Rate:  [73-83] 83 (04/27 0529) Resp:  [18] 18 (04/27 0529) BP: (121-130)/(62-70) 121/62 mmHg (04/27 0529) SpO2:  [96 %-97 %] 97 % (04/27 0529) Weight:  [92.022 kg (202 lb 14 oz)] 92.022 kg (202 lb 14 oz) (04/27 0529) Last BM Date: 11/14/13  Intake/Output from previous day: 04/26 0701 - 04/27 0700 In: 3448.8 [P.O.:480; I.V.:2968.8] Out: 2775 [Urine:2775] Intake/Output this shift:    General appearance: alert, cooperative, appears stated age and wife at bedside Head: Normocephalic, without obvious abnormality, atraumatic Neck: no adenopathy, no carotid bruit, no JVD, supple, symmetrical, trachea midline and thyroid not enlarged, symmetric, no tenderness/mass/nodules Back: symmetric, no curvature. ROM normal. No CVA tenderness. Resp: no labred breathing Chest wall: no tenderness Cardio: regular rate and rhythm, S1, S2 normal, no murmur, click, rub or gallop GI: soft, non-tender; bowel sounds normal; no masses,  no organomegaly Male genitalia: normal Pulses: 2+ and symmetric Skin: Skin color, texture, turgor normal. No rashes or lesions Lymph nodes: Cervical, supraclavicular, and axillary nodes normal. Neurologic: Grossly normal  Lab Results:   Recent Labs  11/14/13 0810 11/15/13 0527  WBC 4.9 8.1  HGB 15.3 13.5  HCT 42.8 39.1  PLT 146* 166   BMET  Recent Labs  11/14/13 0810 11/15/13 0527  NA 134* 139  K 4.1 4.1  CL 98 104  CO2 22 21  GLUCOSE 105* 158*  BUN 20 18  CREATININE 1.10 0.85  CALCIUM 8.9 8.6   PT/INR No  results found for this basename: LABPROT, INR,  in the last 72 hours ABG No results found for this basename: PHART, PCO2, PO2, HCO3,  in the last 72 hours  Studies/Results: Dg Chest 2 View  11/14/2013   CLINICAL DATA:  Fever, rash, recent prostate biopsy  EXAM: CHEST  2 VIEW  COMPARISON:  None.  FINDINGS: Vague patchy opacity overlying the right mid lung, equivocal and favored to reflect prominent pulmonary markings, although pneumonia is not excluded. No pleural effusion or pneumothorax.  The heart is normal in size.  Degenerative changes of the visualized thoracolumbar spine.  IMPRESSION: Vague patchy opacity overlying the right mid lung, equivocal, pneumonia not excluded.   Electronically Signed   By: Charline BillsSriyesh  Krishnan M.D.   On: 11/14/2013 10:27   Ct Abdomen Pelvis W Contrast  11/14/2013   CLINICAL DATA:  Fever and rash.  Recent prostate biopsy  EXAM: CT ABDOMEN AND PELVIS WITH CONTRAST  TECHNIQUE: Multidetector CT imaging of the abdomen and pelvis was performed using the standard protocol following bolus administration of intravenous contrast.  CONTRAST:  50mL OMNIPAQUE IOHEXOL 300 MG/ML SOLN, 100mL OMNIPAQUE IOHEXOL 300 MG/ML SOLN  COMPARISON:  None.  FINDINGS: Lung bases are clear.  No pericardial fluid.  No focal hepatic lesion. The gallbladder, pancreas, spleen, adrenal glands, and kidneys are normal.  The stomach, small bowel, and cecum are normal. Colon and rectosigmoid colon are normal.  Abdominal aorta is normal caliber. No retroperitoneal periportal lymphadenopathy. No free fluid in the pelvis. The prostate gland is normal. No evidence of prostatic inflammation. No fluid in the pelvis.  The bladder likewise appears normal. No pelvic lymphadenopathy. No aggressive osseous lesion.  IMPRESSION: 1. No CT evidence of prostate infection or pelvic infection. 2. No evidence of infection or inflammation in the abdomen.   Electronically Signed   By: Genevive BiStewart  Edmunds M.D.   On: 11/14/2013 14:23     Anti-infectives: Anti-infectives   Start     Dose/Rate Route Frequency Ordered Stop   11/14/13 2200  vancomycin (VANCOCIN) 1,250 mg in sodium chloride 0.9 % 250 mL IVPB  Status:  Discontinued     1,250 mg 166.7 mL/hr over 90 Minutes Intravenous Every 12 hours 11/14/13 1004 11/14/13 1007   11/14/13 1030  vancomycin (VANCOCIN) 2,000 mg in sodium chloride 0.9 % 500 mL IVPB  Status:  Discontinued     2,000 mg 250 mL/hr over 120 Minutes Intravenous  Once 11/14/13 1004 11/14/13 1007   11/14/13 1030  imipenem-cilastatin (PRIMAXIN) 500 mg in sodium chloride 0.9 % 100 mL IVPB  Status:  Discontinued     500 mg 200 mL/hr over 30 Minutes Intravenous 4 times per day 11/14/13 1004 11/14/13 1007      Assessment/Plan:  1 - Fever After Prostate Biopsy -  Unusual time course. Now afebrile and most recent CX's negative. Agree with holding off additional antibiotics. Greatly appreciate hospitalist and ID help. We will arrange outpatient Urology FU in about 6mos with PSA.   Stephen Sloan Anneta Rounds 11/16/2013

## 2013-11-16 NOTE — Discharge Summary (Signed)
Physician Discharge Summary  Stephen Sloan ZOX:[id number] DOB: [birth year] DOA: 11/14/2013  PCP: Lenora BoysFRIED, [name], MD  Admit date: 11/14/2013 Discharge date: 11/16/2013  Time spent: 60 minutes  Recommendations for Outpatient Follow-up:  1. Followup with FRIED, [name], MD one week post discharge. All followup patient's maculopapular rash/allergic drug reaction will need to be reassessed at that time. Blood cultures and urine cultures which were obtained during the hospitalization will need to be followed up upon. 2. Followup with Dr. Berneice HeinrichManny as scheduled.  Discharge Diagnoses:  Principal Problem:   Allergic drug rash Active Problems:   Prostatitis, acute   Maculopapular rash, generalized   Fever   Dehydration   Allergic drug reaction   Dengue fever   Discharge Condition: Stable and improved  Diet recommendation: Regular  Filed Weights   11/14/13 0942 11/15/13 0457 11/16/13 0529  Weight: 92.987 kg (205 lb) 93.033 kg (205 lb 1.6 oz) 92.022 kg (202 lb 14 oz)    History of present illness:  Stephen Sloan is a 58 y.o. male  With history of hernia repair as an infant, recent prostate biopsy per Dr. Berneice HeinrichManny 11/09/2013 who presents to the ED with diffuse rash and fever. Patient states he received 3 days of Bactrim prior to his prostate biopsy and 2 more days of Bactrim post biopsy. Patient also received gentamicin for the biopsy. 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 Patient 2 days post biopsy developed a fever of 100.9 and spoke with urology MD on call who started patient on Levaquin 500 mg daily. Levaquin was intended as a daily dose however states was confusing and stated take 1 daily with meals so patient had been taking Levaquin 3 times daily. One day prior to admission patient stated he had a fever of 101.5 was seen in the urology office at which point in time urinalysis which was done was unremarkable except for microhematuria patient received a dose of  IM Rocephin and was to followup on Monday however patient continued to spike fevers a one-to-one 0.5 and developed a diffuse maculopapular rash with no improvement with Benadryl and cortisone cream subsequently presented to the ED. Patient does endorse some chills and clamminess, some shortness of breath on exertion, generalized weakness with aching joints and some suprapubic tenderness and lower back pain. Patient denies any chest pain, no nausea, no vomiting, no diarrhea, no constipation, no cough. Patient also states rash is itching.  Patient was seen in the emergency room urinalysis which was done was positive for microhematuria however unremarkable. Comprehensive metabolic profile done and a sodium of 134 otherwise was unremarkable. CBC done was unremarkable.  Patient was seen in consultation by urology, we were called to admit the patient for further evaluation and management.   Hospital Course:  #1. Allergic Drug Reaction  Patient had presented to the emergency room with fevers, diffuse maculopapular rash after prostate biopsy. Patient was seen in consultation by infectious diseases. Likely drug-induced secondary to either Bactrim, gentamicin, Levaquin or IM Rocephin which patient had received pre-and post biopsy. Patient was admitted and pancultured. Patient was placed empirically on IV Solu-Medrol, IV Benadryl, IV Pepcid. These medications were subsequently tapered to oral tablets. Patient improved clinically did not have any further fevers and his maculopapular rash improved significantly during the hospitalization. Patient be discharged home on a steroid taper, Pepcid taper and Benadryl taper. Patient will followup with PCP one week post discharge.  #2 fever  Likely secondary to problem #1. Patient has  been pancultured and cultures are pending with no growth to date. Patient improved clinically. Patient remained afebrile throughout the hospitalization off of any antibiotics. Patient was seen in  consultation by ID and recommended following patient off antibiotics. Patient will need to followup with PCP as outpatient will need to followup on patient's cultures. #3 maculopapular rash  Likely secondary to problem #1. Patient was placed on IV Solu-Medrol, IV Benadryl, IV Pepcid. Patient improved clinically. Steroids, antihistamines were tapered down and patient be discharged on tapered oral prednisone, tapered Benadryl, tapered Pepcid. Patient is to followup with PCP as outpatient.  #4 dehydration  On admission patient was noted to be dehydrated. Patient was hydrated with IV fluids and was euvolemic by day of discharge.  #5 status post recent prostate biopsy  Stable. Urology followed the patient during the hospitalization. Outpatient followup.   Procedures: CT abdomen and pelvis 11/14/2013   Consultations: Urology: Dr. Annabell HowellsWrenn 11/14/2013  \Infectious diseases: Dr. Ninetta LightsHatcher 11/14/2013   Discharge Exam: Filed Vitals:   11/16/13 1306  BP: 139/68  Pulse: 52  Temp:   Resp: 18    General: NAD. Improved maculopapular rash. Cardiovascular: RRR Respiratory: CTAB  Discharge Instructions You were cared for by a hospitalist during your hospital stay. If you have any questions about your discharge medications or the care you received while you were in the hospital after you are discharged, you can call the unit and asked to speak with the hospitalist on call if the hospitalist that took care of you is not available. Once you are discharged, your primary care physician will handle any further medical issues. Please note that NO REFILLS for any discharge medications will be authorized once you are discharged, as it is imperative that you return to your primary care physician (or establish a relationship with a primary care physician if you do not have one) for your aftercare needs so that they can reassess your need for medications and monitor your lab values.      Discharge Orders   Future  Orders Complete By Expires   Diet general  As directed    Discharge instructions  As directed    Increase activity slowly  As directed        Medication List    STOP taking these medications       hydrocortisone cream 1 %     levofloxacin 500 MG tablet  Commonly known as:  LEVAQUIN     ROCEPHIN IJ      TAKE these medications       acetaminophen 325 MG tablet  Commonly known as:  TYLENOL  Take 650 mg by mouth every 6 (six) hours as needed for mild pain.     diphenhydrAMINE 25 mg capsule  Commonly known as:  BENADRYL  Take 1 capsule (25 mg total) by mouth 2 (two) times daily. Take 1 tablet (25mg ) 2 times daily x 2 days, then 1 tablet (25mg ) daily x 3 days then stop or use every 6 hours as needed.     famotidine 20 MG tablet  Commonly known as:  PEPCID  Take 1 tablet (20 mg total) by mouth 2 (two) times daily. Take 1 tablet (20mg ) 2 times daily x 2 days, then 1 tablet (20mg ) daily x 3 days then stop.     predniSONE 20 MG tablet  Commonly known as:  DELTASONE  Take 3 tablets (60 mg total) by mouth 2 (two) times daily with a meal. Take 3 tablets (60mg ) 2 times daily x  2 days, then 3 tablets (60mg ) daily x 3 days, then 2 tablets (40mg ) daily x 3 days, then 1 tablet (20mg ) daily x 3 days then stop.       Allergies  Allergen Reactions  . Levaquin [Levofloxacin] Rash    Suspect but pt unsure, on multiple abx this week  . Rocephin [Ceftriaxone Sodium In Dextrose] Rash    Suspect this drug, patient unsure as was on multiple abx this week   Follow-up Information   Follow up with FRIED, [name], MD. Schedule an appointment as soon as possible for a visit in 1 week.   Specialty:  Family Medicine   Contact information:   [address] [place] [address] [phone]       Follow up with Sebastian Ache, MD. (f/u as scheduled.)    Specialty:  Urology   Contact information:   [address] [facility]  [place] [address] [phone]         The results of significant diagnostics from this hospitalization (including imaging, microbiology, ancillary and laboratory) are listed below for reference.    Significant Diagnostic Studies: Dg Chest 2 View  11/14/2013   CLINICAL DATA:  Fever, rash, recent prostate biopsy  EXAM: CHEST  2 VIEW  COMPARISON:  None.  FINDINGS: Vague patchy opacity overlying the right mid lung, equivocal and favored to reflect prominent pulmonary markings, although pneumonia is not excluded. No pleural effusion or pneumothorax.  The heart is normal in size.  Degenerative changes of the visualized thoracolumbar spine.  IMPRESSION: Vague patchy opacity overlying the right mid lung, equivocal, pneumonia not excluded.   Electronically Signed   By: Charline Bills M.D.   On: 11/14/2013 10:27   Ct Abdomen Pelvis W Contrast  11/14/2013   CLINICAL DATA:  Fever and rash.  Recent prostate biopsy  EXAM: CT ABDOMEN AND PELVIS WITH CONTRAST  TECHNIQUE: Multidetector CT imaging of the abdomen and pelvis was performed using the standard protocol following bolus administration of intravenous contrast.  CONTRAST:  50mL OMNIPAQUE IOHEXOL 300 MG/ML SOLN, OMNIPAQUE IOHEXOL 300 MG/ML SOLN  COMPARISON:  None.  FINDINGS: Lung bases are clear.  No pericardial fluid.  No focal hepatic lesion. The gallbladder, pancreas, spleen, adrenal glands, and kidneys are normal.  The stomach, small bowel, and cecum are normal. Colon and rectosigmoid colon are normal.  Abdominal aorta is normal caliber. No retroperitoneal periportal lymphadenopathy. No free fluid in the pelvis. The prostate gland is normal. No evidence of prostatic inflammation. No fluid in the pelvis. The bladder likewise appears normal. No pelvic lymphadenopathy. No aggressive osseous lesion.  IMPRESSION: 1. No CT evidence of prostate infection or pelvic infection. 2. No evidence of infection or inflammation in the abdomen.   Electronically Signed   By: Genevive Bi M.D.   On:  11/14/2013 14:23    Microbiology: Recent Results (from the past 240 hour(s))  URINE CULTURE     Status: None   Collection Time    11/14/13  8:06 AM      Result Value Ref Range Status   Specimen Description URINE, CLEAN CATCH   Final   Special Requests NONE   Final   Culture  Setup Time     Final   Value: 11/14/2013 13:58     Performed at [facility] Count     Final   Value: NO GROWTH     Performed at [facility]  Final   Value: NO GROWTH     Performed at [facility]   Report Status 11/15/2013 FINAL   Final  CULTURE, BLOOD (ROUTINE X 2)     Status: None   Collection Time    11/14/13  9:51 AM      Result Value Ref Range Status   Specimen Description BLOOD RIGHT HAND   Final   Special Requests BOTTLES DRAWN AEROBIC AND ANAEROBIC 5 CC   Final   Culture  Setup Time     Final   Value: 11/14/2013 13:50     Performed at [facility]   Culture     Final   Value:        BLOOD CULTURE RECEIVED NO GROWTH TO DATE CULTURE WILL BE HELD FOR 5 DAYS BEFORE ISSUING A FINAL NEGATIVE REPORT     Performed at [facility]   Report Status PENDING   Incomplete  CULTURE, BLOOD (ROUTINE X 2)     Status: None   Collection Time    11/14/13 10:07 AM      Result Value Ref Range Status   Specimen Description BLOOD LEFT HAND   Final   Special Requests BOTTLES DRAWN AEROBIC AND ANAEROBIC 4 CC   Final   Culture  Setup Time     Final   Value: 11/14/2013 13:50     Performed at [facility]   Culture     Final   Value:        BLOOD CULTURE RECEIVED NO GROWTH TO DATE CULTURE WILL BE HELD FOR 5 DAYS BEFORE ISSUING A FINAL NEGATIVE REPORT     Performed at [facility]   Report Status PENDING   Incomplete     Labs: Basic Metabolic Panel:  Recent Labs Lab 11/14/13 0810 11/15/13 0527  NA 134* 139  K 4.1 4.1  CL 98 104  CO2 22 21  GLUCOSE 105* 158*  BUN 20 18  CREATININE 1.10 0.85  CALCIUM 8.9 8.6  MG 1.8  --     Liver Function Tests:  Recent Labs Lab 11/14/13 0810  AST 34  ALT 23  ALKPHOS 79  BILITOT 0.7  PROT 7.3  ALBUMIN 3.6   No results found for this basename: LIPASE, AMYLASE,  in the last 168 hours No results found for this basename: AMMONIA,  in the last 168 hours CBC:  Recent Labs Lab 11/14/13 0810 11/15/13 0527  WBC 4.9 8.1  NEUTROABS 3.0 5.6  HGB 15.3 13.5  HCT 42.8 39.1  MCV 86.6 87.7  PLT 146* 166   Cardiac Enzymes: No results found for this basename: CKTOTAL, CKMB, CKMBINDEX, TROPONINI,  in the last 168 hours BNP: BNP (last 3 results) No results found for this basename: PROBNP,  in the last 8760 hours CBG: No results found for this basename: GLUCAP,  in the last 168 hours     Signed:  Rodolph Bong MD [facility] 11/16/2013, 1:47 PM

## 2013-11-20 LAB — CULTURE, BLOOD (ROUTINE X 2)
CULTURE: NO GROWTH
Culture: NO GROWTH

## 2014-11-11 ENCOUNTER — Other Ambulatory Visit (HOSPITAL_COMMUNITY): Payer: Self-pay | Admitting: Urology

## 2014-11-11 DIAGNOSIS — R972 Elevated prostate specific antigen [PSA]: Secondary | ICD-10-CM

## 2014-11-30 ENCOUNTER — Ambulatory Visit (HOSPITAL_COMMUNITY)
Admission: RE | Admit: 2014-11-30 | Discharge: 2014-11-30 | Disposition: A | Discharge status: Home / Self Care | Payer: Managed Care, Other (non HMO) | Source: Ambulatory Visit | Attending: Urology | Admitting: Urology

## 2014-11-30 DIAGNOSIS — R972 Elevated prostate specific antigen [PSA]: Secondary | ICD-10-CM | POA: Insufficient documentation

## 2014-11-30 MED ORDER — GADOBENATE DIMEGLUMINE 529 MG/ML IV SOLN
20.0000 mL | Freq: Once | INTRAVENOUS | Status: AC | PRN
  Administered 2014-11-30: 20 mL via INTRAVENOUS

## 2015-05-29 IMAGING — CT CT ABD-PELV W/ CM
2 of 5 series · 17 of 46 positions shown, 19 images · IV contrast (omnipaque)
Comparison: None.

CLINICAL DATA: Fever and rash.  Recent prostate biopsy

EXAM:
CT ABDOMEN AND PELVIS WITH CONTRAST
TECHNIQUE: Multidetector CT imaging of the abdomen and pelvis was performed
using the standard protocol following bolus administration of
intravenous contrast.
CONTRAST:  50mL OMNIPAQUE IOHEXOL 300 MG/ML SOLN, 100mL OMNIPAQUE
IOHEXOL 300 MG/ML SOLN

[Series 2: rtn a/p with · axial · 0.76mm/px · z∈[+880,+1310]mm · 14 of 98 slices shown, 16 images]
[im 6/98  soft-tissue]
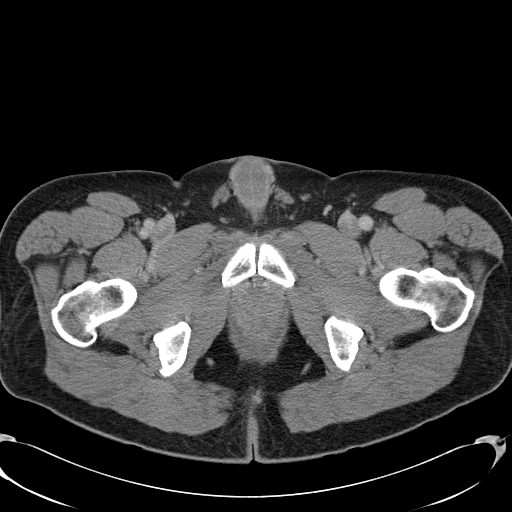
[im 6/98  bone]
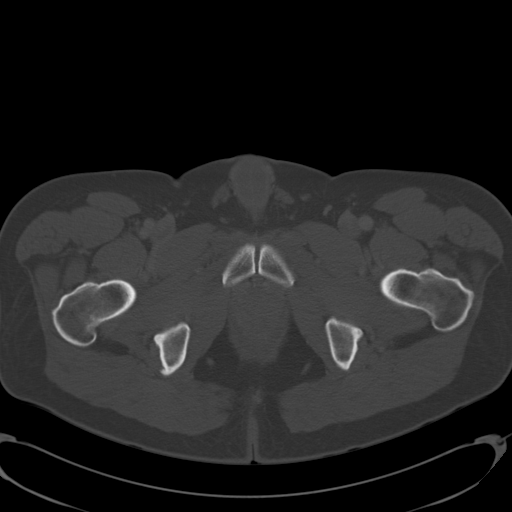
[im 11/98  soft-tissue]
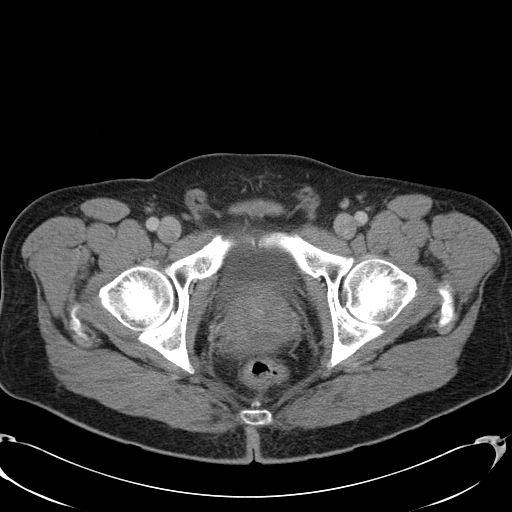
[im 22/98  soft-tissue]
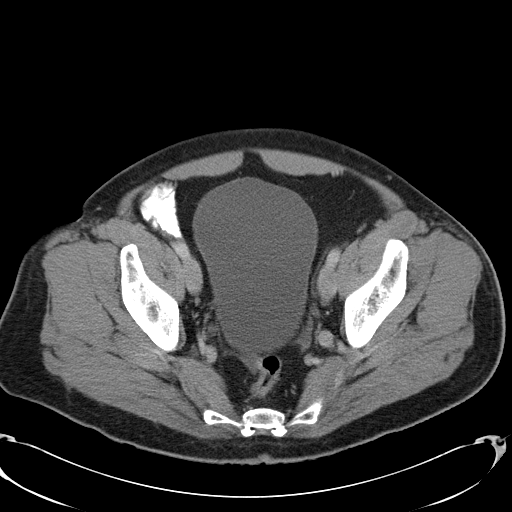
[im 27/98  soft-tissue]
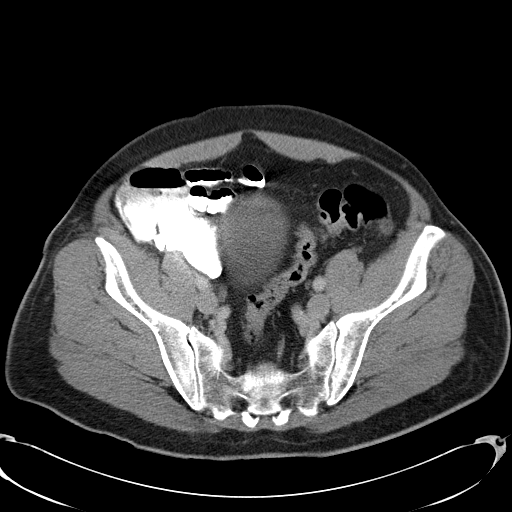
[im 33/98  soft-tissue]
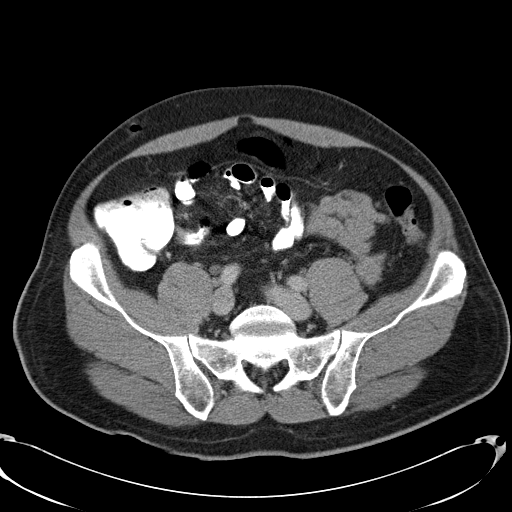
[im 38/98  soft-tissue]
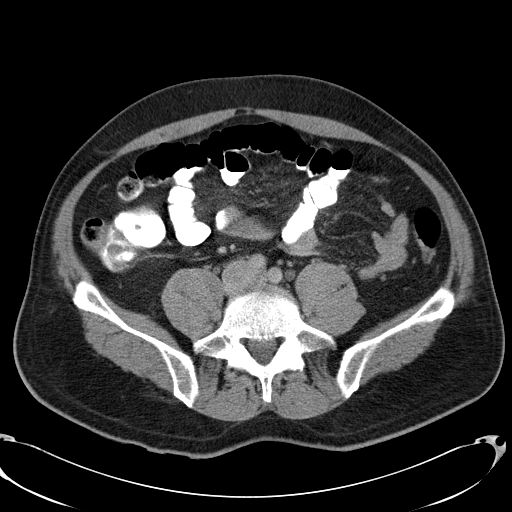
[im 44/98  soft-tissue]
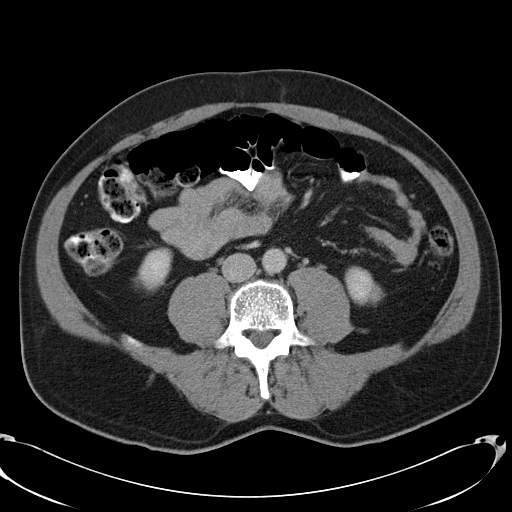
[im 54/98  soft-tissue]
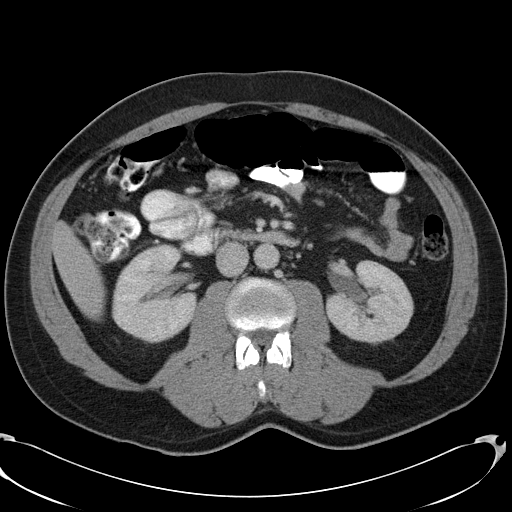
[im 60/98  soft-tissue]
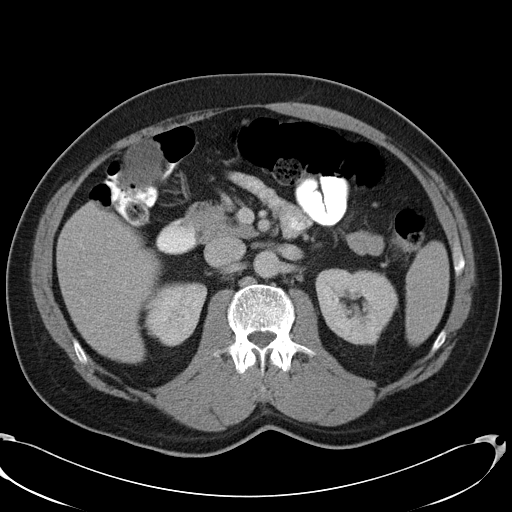
[im 60/98  bone]
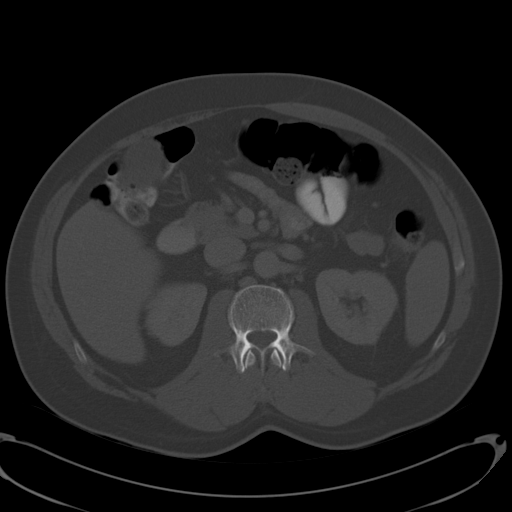
[im 65/98  soft-tissue]
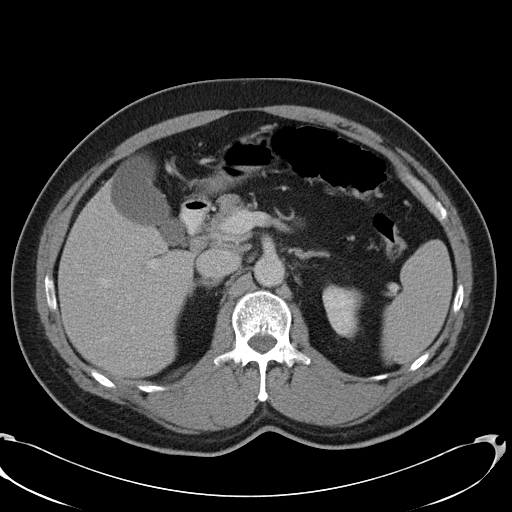
[im 71/98  soft-tissue]
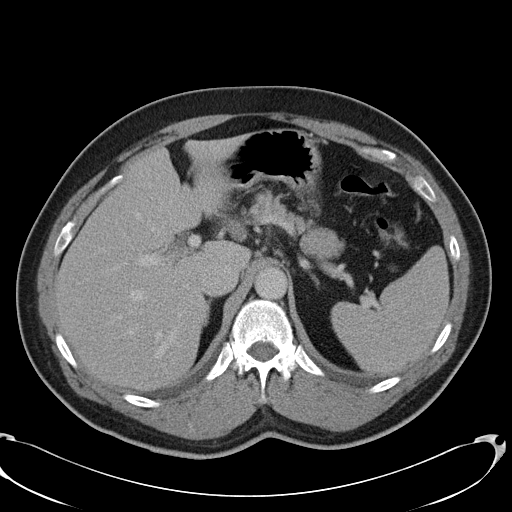
[im 76/98  soft-tissue]
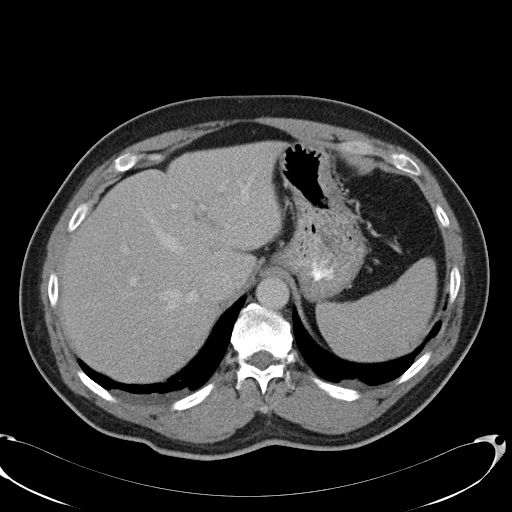
[im 87/98  soft-tissue]
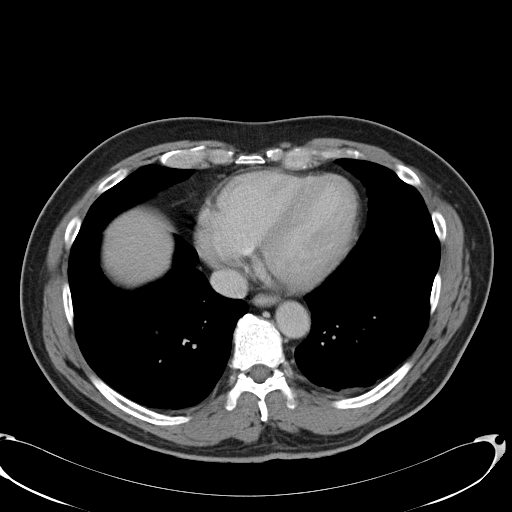
[im 92/98  soft-tissue]
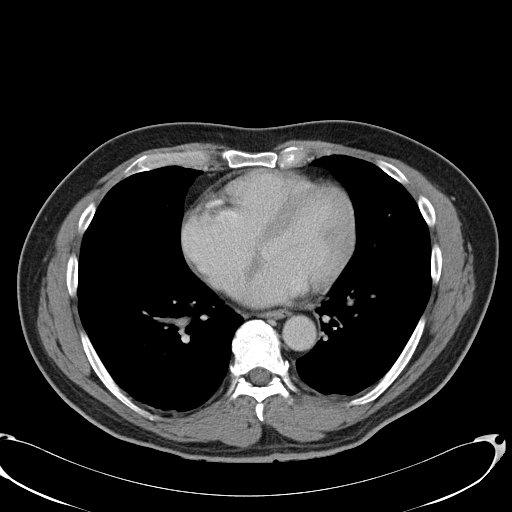

[Series 602: coronal · coronal · 0.95mm/px · 3 of 101 slices shown]
[im 34/101  soft-tissue]
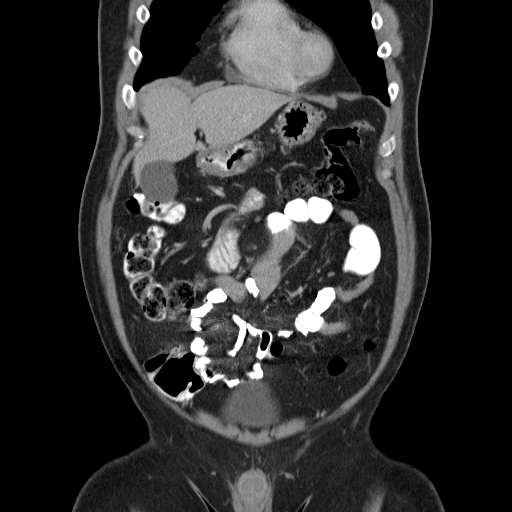
[im 45/101  soft-tissue]
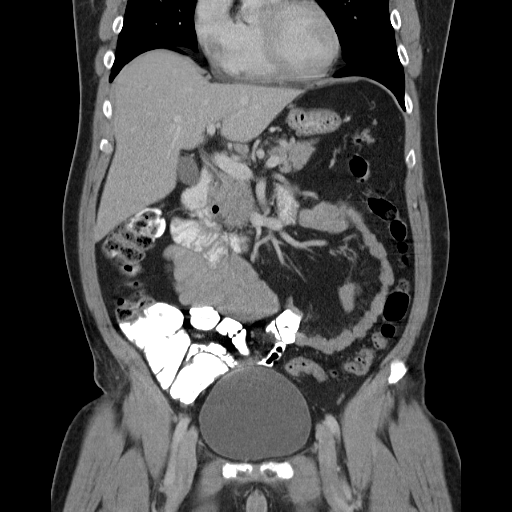
[im 56/101  soft-tissue]
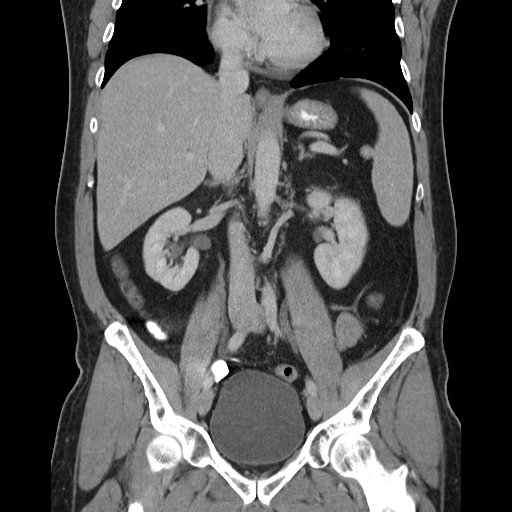

[17 of 46 positions shown; findings below may reference images not displayed]

FINDINGS: Lung bases are clear.  No pericardial fluid.

No focal hepatic lesion. The gallbladder, pancreas, spleen, adrenal
glands, and kidneys are normal.

The stomach, small bowel, and cecum are normal. Colon and
rectosigmoid colon are normal.

Abdominal aorta is normal caliber. No retroperitoneal periportal
lymphadenopathy. No free fluid in the pelvis. The prostate gland is
normal. No evidence of prostatic inflammation. No fluid in the
pelvis. The bladder likewise appears normal. No pelvic
lymphadenopathy. No aggressive osseous lesion.
IMPRESSION: 1. No CT evidence of prostate infection or pelvic infection.
2. No evidence of infection or inflammation in the abdomen.

## 2018-12-29 DIAGNOSIS — Z03818 Encounter for observation for suspected exposure to other biological agents ruled out: Secondary | ICD-10-CM | POA: Diagnosis not present

## 2019-01-21 DIAGNOSIS — H35362 Drusen (degenerative) of macula, left eye: Secondary | ICD-10-CM | POA: Diagnosis not present

## 2019-01-21 DIAGNOSIS — H02831 Dermatochalasis of right upper eyelid: Secondary | ICD-10-CM | POA: Diagnosis not present

## 2019-01-21 DIAGNOSIS — H2513 Age-related nuclear cataract, bilateral: Secondary | ICD-10-CM | POA: Diagnosis not present

## 2019-01-21 DIAGNOSIS — H02834 Dermatochalasis of left upper eyelid: Secondary | ICD-10-CM | POA: Diagnosis not present

## 2019-03-13 DIAGNOSIS — Z03818 Encounter for observation for suspected exposure to other biological agents ruled out: Secondary | ICD-10-CM | POA: Diagnosis not present

## 2019-04-07 DIAGNOSIS — E785 Hyperlipidemia, unspecified: Secondary | ICD-10-CM | POA: Diagnosis not present

## 2019-04-07 DIAGNOSIS — Z23 Encounter for immunization: Secondary | ICD-10-CM | POA: Diagnosis not present

## 2019-04-07 DIAGNOSIS — Z0000 Encounter for general adult medical examination without abnormal findings: Secondary | ICD-10-CM | POA: Diagnosis not present

## 2019-06-30 DIAGNOSIS — N401 Enlarged prostate with lower urinary tract symptoms: Secondary | ICD-10-CM | POA: Diagnosis not present

## 2019-06-30 DIAGNOSIS — R3915 Urgency of urination: Secondary | ICD-10-CM | POA: Diagnosis not present

## 2019-08-04 DIAGNOSIS — R3915 Urgency of urination: Secondary | ICD-10-CM | POA: Diagnosis not present

## 2019-08-04 DIAGNOSIS — N5201 Erectile dysfunction due to arterial insufficiency: Secondary | ICD-10-CM | POA: Diagnosis not present

## 2019-08-04 DIAGNOSIS — R972 Elevated prostate specific antigen [PSA]: Secondary | ICD-10-CM | POA: Diagnosis not present

## 2019-09-06 DIAGNOSIS — Z20828 Contact with and (suspected) exposure to other viral communicable diseases: Secondary | ICD-10-CM | POA: Diagnosis not present

## 2019-09-07 DIAGNOSIS — Z0000 Encounter for general adult medical examination without abnormal findings: Secondary | ICD-10-CM | POA: Diagnosis not present

## 2019-10-26 DIAGNOSIS — R972 Elevated prostate specific antigen [PSA]: Secondary | ICD-10-CM | POA: Diagnosis not present

## 2019-11-02 DIAGNOSIS — N5201 Erectile dysfunction due to arterial insufficiency: Secondary | ICD-10-CM | POA: Diagnosis not present

## 2019-11-02 DIAGNOSIS — R972 Elevated prostate specific antigen [PSA]: Secondary | ICD-10-CM | POA: Diagnosis not present

## 2019-11-02 DIAGNOSIS — R3915 Urgency of urination: Secondary | ICD-10-CM | POA: Diagnosis not present

## 2020-01-22 DIAGNOSIS — H2513 Age-related nuclear cataract, bilateral: Secondary | ICD-10-CM | POA: Diagnosis not present

## 2020-01-22 DIAGNOSIS — H02831 Dermatochalasis of right upper eyelid: Secondary | ICD-10-CM | POA: Diagnosis not present

## 2020-01-22 DIAGNOSIS — H35362 Drusen (degenerative) of macula, left eye: Secondary | ICD-10-CM | POA: Diagnosis not present

## 2020-01-22 DIAGNOSIS — H02834 Dermatochalasis of left upper eyelid: Secondary | ICD-10-CM | POA: Diagnosis not present

## 2020-05-03 DIAGNOSIS — R3915 Urgency of urination: Secondary | ICD-10-CM | POA: Diagnosis not present

## 2020-05-03 DIAGNOSIS — N401 Enlarged prostate with lower urinary tract symptoms: Secondary | ICD-10-CM | POA: Diagnosis not present

## 2020-05-10 DIAGNOSIS — R3915 Urgency of urination: Secondary | ICD-10-CM | POA: Diagnosis not present

## 2020-05-10 DIAGNOSIS — N5201 Erectile dysfunction due to arterial insufficiency: Secondary | ICD-10-CM | POA: Diagnosis not present

## 2020-05-10 DIAGNOSIS — R972 Elevated prostate specific antigen [PSA]: Secondary | ICD-10-CM | POA: Diagnosis not present

## 2020-08-15 DIAGNOSIS — Z1152 Encounter for screening for COVID-19: Secondary | ICD-10-CM | POA: Diagnosis not present

## 2020-12-08 DIAGNOSIS — S50869A Insect bite (nonvenomous) of unspecified forearm, initial encounter: Secondary | ICD-10-CM | POA: Diagnosis not present

## 2021-04-17 DIAGNOSIS — L309 Dermatitis, unspecified: Secondary | ICD-10-CM | POA: Diagnosis not present

## 2021-04-25 DIAGNOSIS — R972 Elevated prostate specific antigen [PSA]: Secondary | ICD-10-CM | POA: Diagnosis not present

## 2021-05-02 DIAGNOSIS — N5201 Erectile dysfunction due to arterial insufficiency: Secondary | ICD-10-CM | POA: Diagnosis not present

## 2021-05-02 DIAGNOSIS — R3915 Urgency of urination: Secondary | ICD-10-CM | POA: Diagnosis not present

## 2021-05-02 DIAGNOSIS — R972 Elevated prostate specific antigen [PSA]: Secondary | ICD-10-CM | POA: Diagnosis not present

## 2021-07-10 NOTE — Progress Notes (Signed)
Phone: [phone]   Subjective:  Patient presents today to establish care.  Prior patient of Dr. Eudelia Bunch.  Chief Complaint  Patient presents with   TOC    Pt states his dad died of cancer so he would like lab work and to keep an eye on his over all health.   Annual Exam   See problem oriented charting Review of Systems  Constitutional:  Positive for chills and fever (with influenza).  HENT:  Positive for congestion, sinus pain and sore throat. Negative for hearing loss.   Eyes:  Negative for blurred vision and double vision.  Respiratory:  Positive for cough. Negative for shortness of breath.   Cardiovascular:  Negative for chest pain and palpitations.  Gastrointestinal:  Negative for abdominal pain, blood in stool, constipation, diarrhea, heartburn, melena, nausea and vomiting.  Genitourinary:  Negative for dysuria and frequency.  Musculoskeletal:  Positive for myalgias. Negative for back pain and neck pain.  Skin:  Negative for itching and rash.  Neurological:  Positive for headaches. Negative for dizziness.  Endo/Heme/Allergies:  Negative for polydipsia. Does not bruise/bleed easily.  Psychiatric/Behavioral:  Negative for depression and suicidal ideas.     The following were reviewed and entered/updated in epic: Past Medical History:  Diagnosis Date   Dengue fever 2012   Hypercholesteremia    Patient Active Problem List   Diagnosis Date Noted   BPH associated with nocturia 07/31/2021    Priority: Medium    Dengue fever     Priority: Low   Past Surgical History:  Procedure Laterality Date   HERNIA REPAIR     as a child   PROSTATE BIOPSY     around 2015 benign    Family History  Problem Relation Age of Onset   Healthy Mother    CAD Father        heart attack in 30s, ongoing angina   Non-Hodgkin's lymphoma Father        stage III at 39   Hyperlipidemia Father        overweight   Obesity Sister    Arthritis Sister        hips and knees   Obesity  Brother    CAD Paternal Grandfather        MI age 70    Medications- reviewed and updated Current Outpatient Medications  Medication Sig Dispense Refill   acetaminophen (TYLENOL) 325 MG tablet Take 650 mg by mouth every 6 (six) hours as needed for mild pain.     finasteride (PROSCAR) 5 MG tablet Take 5 mg by mouth daily.     No current facility-administered medications for this visit.    Allergies-reviewed and updated Allergies  Allergen Reactions   Levaquin [Levofloxacin] Rash    Suspect but pt unsure, on multiple abx this week   Rocephin [Ceftriaxone Sodium In Dextrose] Rash    Suspect this drug, patient unsure as was on multiple abx this week    Social History   Social History Narrative   Married. 4 kids. 13 grandkids.       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       Hobbies: enjoys jogging around 5k, travel       Objective  Objective:  BP 130/80    Pulse 71    Temp 97.7 F (36.5 C)    Ht 6' (1.829 m)    Wt 204 lb 6.4 oz (92.7 kg)    SpO2 99%  BMI 27.72 kg/m  Gen: NAD, resting comfortably HEENT: Mucous membranes are moist. Oropharynx largely normal other than some drainage noted TM normal. Nasal turbinates erythematous and slightly worsen Eyes: sclera and lids normal, PERRLA Neck: no thyromegaly, no cervical lymphadenopathy CV: RRR no murmurs rubs or gallops Lungs: CTAB no crackles, wheeze, rhonchi Abdomen: soft/nontender/nondistended/normal bowel sounds. No rebound or guarding.  Ext: no edema Skin: warm, dry Neuro: 5/5 strength in upper and lower extremities, normal gait, normal reflexes   Assessment and Plan:   65 y.o. male presenting for annual physical.  Health Maintenance counseling: 1. Anticipatory guidance: Patient counseled regarding regular dental exams -q6 months, eye exams -yearly,  avoiding smoking and second hand smoke , limiting alcohol to 2 beverages per day - doesn't drink, no illicit drugs.   2. Risk factor  reduction:  Advised patient of need for regular exercise and diet rich and fruits and vegetables to reduce risk of heart attack and stroke.  Exercise- enjoys about 5k runs usually 1-2x a week, some walking outside of.  Diet/weight management-hs weight around 170- normally around 195-200- we discussed working on 10 lb weight loss.  Wt Readings from Last 3 Encounters:  07/31/21 204 lb 6.4 oz (92.7 kg)  11/16/13 202 lb 14 oz (92 kg)  3. Immunizations/screenings/ancillary studies- will get records for vaccines- did decline flu and covid today (no recent covid) Immunization History  Administered Date(s) Administered   Janssen (J&J) SARS-COV-2 Vaccination 11/23/2019   Tdap 12/23/2015  4. Prostate cancer screening-  has BPH followed with Dr. Berneice Heinrich- on long term finasteride- they check his PSAs . Biopsy around 2015 benign 5. Colon cancer screening - has had within 10 years- we will get records- no history of polyps and was told 10 year repeat.  6. Skin cancer screening- last derm visit 5 years ago in [place]- not interested at moments. advised regular sunscreen use. Denies worrisome, changing, or new skin lesions.  7. Smoking associated screening (lung cancer screening, AAA screen 65-75, UA)- Never smoker 8. STD screening - only active with wife  Status of chronic or acute concerns   # BPH- follows with Dr. Berneice Heinrich S:medication: finasteride 5 mg  A/P: Controlled. Continue current medications.  Psa with their office.    #hyperlipidemia S: Medication: none . Prior chest pain workup he reports about 10 years ago including stress test. Plus later had ED visit 11/27/16 reassuring workup - troponin, d dimer- no further cardiac testing. He reports this ended up actually being dental/jaw pain A/P: update lipids but with strong family history CAD go ahead and get Ct cardiac scoring to help set lipid goals  # influenza S: Recovering from influenza. Took tamiflu starting Wednesday. Some lingering throat  irritation and cough. Was still able to preach yesterday but felt very tired after 2 services.  A/P: seems to be recovering- some lingering throat soreness/fatigue with speech- needs more rest/time for now- if new or worsening symptoms eyond this point should see [place] back   Recommended follow up: Return in about 1 year (around 07/31/2022) for physical or sooner if needed.  Lab/Order associations: fasting   ICD-10-CM   1. Preventative health care  Z00.00 CBC with Differential/Platelet    Comprehensive metabolic panel    Lipid panel    2. BPH associated with nocturia  N40.1    R35.1     3. Hyperlipidemia, unspecified hyperlipidemia type  E78.5 CBC with Differential/Platelet    Comprehensive metabolic panel    Lipid panel     Return  precautions advised.  Tana Conch, MD

## 2021-07-31 ENCOUNTER — Encounter: Payer: Self-pay | Admitting: Family Medicine

## 2021-07-31 ENCOUNTER — Ambulatory Visit (INDEPENDENT_AMBULATORY_CARE_PROVIDER_SITE_OTHER): Payer: Self-pay | Admitting: Family Medicine

## 2021-07-31 ENCOUNTER — Other Ambulatory Visit: Payer: Self-pay

## 2021-07-31 VITALS — BP 130/80 | HR 71 | Temp 97.7°F | Ht 72.0 in | Wt 204.4 lb

## 2021-07-31 DIAGNOSIS — N401 Enlarged prostate with lower urinary tract symptoms: Secondary | ICD-10-CM

## 2021-07-31 DIAGNOSIS — R351 Nocturia: Secondary | ICD-10-CM

## 2021-07-31 DIAGNOSIS — E785 Hyperlipidemia, unspecified: Secondary | ICD-10-CM

## 2021-07-31 DIAGNOSIS — Z0000 Encounter for general adult medical examination without abnormal findings: Secondary | ICD-10-CM

## 2021-07-31 LAB — COMPREHENSIVE METABOLIC PANEL
ALT: 28 U/L (ref 0–53)
AST: 25 U/L (ref 0–37)
Albumin: 4.3 g/dL (ref 3.5–5.2)
Alkaline Phosphatase: 66 U/L (ref 39–117)
BUN: 13 mg/dL (ref 6–23)
CO2: 28 mEq/L (ref 19–32)
Calcium: 9.3 mg/dL (ref 8.4–10.5)
Chloride: 100 mEq/L (ref 96–112)
Creatinine, Ser: 0.98 mg/dL (ref 0.40–1.50)
GFR: 82.18 mL/min (ref >60.00)
Glucose, Bld: 86 mg/dL (ref 70–99)
Potassium: 4.2 mEq/L (ref 3.5–5.1)
Sodium: 136 mEq/L (ref 135–145)
Total Bilirubin: 1.1 mg/dL (ref 0.2–1.2)
Total Protein: 7.5 g/dL (ref 6.0–8.3)

## 2021-07-31 LAB — CBC WITH DIFFERENTIAL/PLATELET
Basophils Absolute: 0.1 10*3/uL (ref 0.0–0.1)
Basophils Relative: 0.5 % (ref 0.0–3.0)
Eosinophils Absolute: 0.2 10*3/uL (ref 0.0–0.7)
Eosinophils Relative: 2.2 % (ref 0.0–5.0)
HCT: 40.8 % (ref 39.0–52.0)
Hemoglobin: 14.1 g/dL (ref 13.0–17.0)
Lymphocytes Relative: 29.3 % (ref 12.0–46.0)
Lymphs Abs: 3.1 10*3/uL (ref 0.7–4.0)
MCHC: 34.5 g/dL (ref 30.0–36.0)
MCV: 87.6 fl (ref 78.0–100.0)
Monocytes Absolute: 0.9 10*3/uL (ref 0.1–1.0)
Monocytes Relative: 8.2 % (ref 3.0–12.0)
Neutro Abs: 6.4 10*3/uL (ref 1.4–7.7)
Neutrophils Relative %: 59.8 % (ref 43.0–77.0)
Platelets: 326 10*3/uL (ref 150.0–400.0)
RBC: 4.66 Mil/uL (ref 4.22–5.81)
RDW: 12.7 % (ref 11.5–15.5)
WBC: 10.7 10*3/uL — ABNORMAL HIGH (ref 4.0–10.5)

## 2021-07-31 LAB — LIPID PANEL
Cholesterol: 197 mg/dL (ref 0–200)
HDL: 41.7 mg/dL (ref >39.00)
LDL Cholesterol: 132 mg/dL — ABNORMAL HIGH (ref 0–99)
NonHDL: 155
Total CHOL/HDL Ratio: 5
Triglycerides: 116 mg/dL (ref 0.0–149.0)
VLDL: 23.2 mg/dL (ref 0.0–40.0)

## 2021-07-31 NOTE — Patient Instructions (Addendum)
Please stop by lab before you go If you have mychart- we will send your results within 3 business days of [place] receiving them.  If you do not have mychart- we will call you about results within 5 business days of [place] receiving them.  *please also note that you will see labs on mychart as soon as they post. I will later go in and write notes on them- will say "notes from Dr. Durene Cal"   Sign release of information at the check out desk for ALL immunizations to primary care  Sign release of information at the check out desk for last colonoscopy with [facility] as well (2 separate forms)  Recommended follow up: Return in about 1 year (around 07/31/2022) for physical or sooner if needed.    We will call you within two weeks about your referral for CT cardiac scoring (will be $99 cash pay). If you do not hear within 2 weeks, give [place] a call.

## 2021-08-03 ENCOUNTER — Telehealth: Payer: Self-pay

## 2021-08-03 MED ORDER — BENZONATATE 100 MG PO CAPS: 100.0000 mg | ORAL_CAPSULE | Freq: Two times a day (BID) | ORAL | 0 refills | Status: DC | PRN

## 2021-08-03 NOTE — Telephone Encounter (Signed)
Patient states he is not sleeping with his cough and would like to see if Dr. Durene Cal could send anything in to help with the cough.

## 2021-08-03 NOTE — Telephone Encounter (Signed)
See below

## 2021-08-04 NOTE — Telephone Encounter (Signed)
Called and spoke with pt and he is aware tessalon sent to pharmacy.

## 2021-08-29 ENCOUNTER — Other Ambulatory Visit: Payer: Self-pay

## 2021-08-29 ENCOUNTER — Ambulatory Visit (INDEPENDENT_AMBULATORY_CARE_PROVIDER_SITE_OTHER)
Admission: RE | Admit: 2021-08-29 | Discharge: 2021-08-29 | Disposition: A | Discharge status: Home / Self Care | Payer: Self-pay | Source: Ambulatory Visit | Attending: Family Medicine | Admitting: Family Medicine

## 2021-08-29 DIAGNOSIS — E785 Hyperlipidemia, unspecified: Secondary | ICD-10-CM

## 2021-09-08 ENCOUNTER — Telehealth: Payer: Self-pay | Admitting: Family Medicine

## 2021-09-08 ENCOUNTER — Other Ambulatory Visit: Payer: Self-pay | Admitting: *Deleted

## 2021-09-08 MED ORDER — DOXYCYCLINE HYCLATE 100 MG PO TABS: 100.0000 mg | ORAL_TABLET | Freq: Two times a day (BID) | ORAL | 0 refills | Status: AC

## 2021-09-08 NOTE — Telephone Encounter (Signed)
Pt returned a call regarding his CT Cardiac Scoring. Dr Durene Cal and CMA are both out. Referred to another CMA to give patient results.

## 2021-09-08 NOTE — Telephone Encounter (Signed)
Rx sent to the pharmacy. Patient notified.  ?

## 2021-09-08 NOTE — Telephone Encounter (Signed)
Team please send in doxycycline 100 mg twice daily #14 to see if that helps him feel better

## 2021-09-08 NOTE — Telephone Encounter (Signed)
Returned call to patient, result note read to patient. Patient verbalized understanding. Patient stated that he has been having congestion the whole month of January.  Notes from Dr. Durene Cal:  Peri [place] news on the CT cardiac scoring-score of 0 which means we can hold off on cholesterol medicine   They did note a nodular area in the right lower lung close to the bone that they recommend a repeat noncontrast CT in 3 months-we will plan on completing this-if you do not hear from [place] within 4 months about this please reach out but I plan to order this in 3 months.  What the radiologist mentions called focal fibrosis can be from local inflammation/irritation of the lung. -There is also an area of possible infection or inflammation in the right lung are you having any cough or congestion?

## 2021-09-22 ENCOUNTER — Telehealth: Payer: Self-pay | Admitting: Family Medicine

## 2021-09-22 NOTE — Telephone Encounter (Signed)
Pt called regarding a prescription dosage question. Please call pt at [phone]. The medication is doxycycline. ?

## 2021-09-22 NOTE — Telephone Encounter (Signed)
Called and spoke with pt and question regarding dosing answered.  ?

## 2021-10-17 DIAGNOSIS — M9901 Segmental and somatic dysfunction of cervical region: Secondary | ICD-10-CM | POA: Diagnosis not present

## 2021-10-17 DIAGNOSIS — R293 Abnormal posture: Secondary | ICD-10-CM | POA: Diagnosis not present

## 2021-10-17 DIAGNOSIS — M542 Cervicalgia: Secondary | ICD-10-CM | POA: Diagnosis not present

## 2021-10-17 DIAGNOSIS — M2560 Stiffness of unspecified joint, not elsewhere classified: Secondary | ICD-10-CM | POA: Diagnosis not present

## 2021-10-25 ENCOUNTER — Encounter: Payer: Self-pay | Admitting: Family Medicine

## 2021-10-25 ENCOUNTER — Ambulatory Visit (INDEPENDENT_AMBULATORY_CARE_PROVIDER_SITE_OTHER): Payer: BC Managed Care – PPO | Admitting: Family Medicine

## 2021-10-25 VITALS — BP 110/68 | HR 68 | Temp 98.1°F | Ht 72.0 in | Wt 205.8 lb

## 2021-10-25 DIAGNOSIS — R911 Solitary pulmonary nodule: Secondary | ICD-10-CM | POA: Diagnosis not present

## 2021-10-25 DIAGNOSIS — M542 Cervicalgia: Secondary | ICD-10-CM | POA: Diagnosis not present

## 2021-10-25 DIAGNOSIS — Z1211 Encounter for screening for malignant neoplasm of colon: Secondary | ICD-10-CM | POA: Diagnosis not present

## 2021-10-25 DIAGNOSIS — F439 Reaction to severe stress, unspecified: Secondary | ICD-10-CM | POA: Diagnosis not present

## 2021-10-25 MED ORDER — CYCLOBENZAPRINE HCL 5 MG PO TABS: 5.0000 mg | ORAL_TABLET | Freq: Every day | ORAL | 0 refills | Status: DC

## 2021-10-25 NOTE — Progress Notes (Signed)
?Phone [phone] ?In person visit ?  ?Subjective:  ? ?Stephen Sloan is a 66 y.o. year old very pleasant male patient who presents for/with See problem oriented charting ?Chief Complaint  ?Patient presents with  ? Neck Pain  ?  Pt is here to f/u on neck pain and MRI results.   ? ? ?This visit occurred during the SARS-CoV-2 public health emergency.  Safety protocols were in place, including screening questions prior to the visit, additional usage of staff PPE, and extensive cleaning of exam room while observing appropriate contact time as indicated for disinfecting solutions.  ? ?Past Medical History-  ?Patient Active Problem List  ? Diagnosis Date Noted  ? BPH associated with nocturia 07/31/2021  ?  Priority: Medium   ? Dengue fever   ?  Priority: Low  ? ? ?Medications- reviewed and updated ?Current Outpatient Medications  ?Medication Sig Dispense Refill  ? acetaminophen (TYLENOL) 325 MG tablet Take 650 mg by mouth every 6 (six) hours as needed for mild pain.    ? cyclobenzaprine (FLEXERIL) 5 MG tablet Take 1 tablet (5 mg total) by mouth at bedtime. 15 tablet 0  ? finasteride (PROSCAR) 5 MG tablet Take 5 mg by mouth daily.    ? ?No current facility-administered medications for this visit.  ? ?  ?Objective:  ?BP 110/68   Pulse 68   Temp 98.1 ?F (36.7 ?C)   Ht 6' (1.829 m)   Wt 205 lb 12.8 oz (93.4 kg)   SpO2 98%   BMI 27.91 kg/m?  ?Gen: NAD, resting comfortably ?No carotid bruits.  No cervical lymphadenopathy ?CV: RRR no murmurs rubs or gallops ?Lungs: CTAB no crackles, wheeze, rhonchi ?Skin: warm, dry, no rash over the neck ?MSK: Right sternocleidomastoid stiffer and firmer than the left side with muscle spasm.  Left side of neck largely normal-reports pain primarily at night.  Limited range of motion when turning head to the right and when trying to touch chin to chest but otherwise largely normal-reports pain with these motions. ?Normal muscle strenght upper and lower extremities, PERRLA  ? ?   ? ?Assessment and Plan  ? ?# Neck Pain ?S: pain started last day being in [place] - march 1st. Had been diving and thought could be related to that vs tide somewhat jerking him. Went to chiropractor and had x-rays as well as manipulation without relief. Feels tension in right side of neck likely sternocleidomastoid- also some pain at back of left side of neck. Feels stiff. Pain up to 7/10 in left neck- 5/10 in right neck- pain is worse with turning.   ?-enjoyed "workcation" with 3 weeks in [place] ? ?- mom with history carotid stenosis and required endarterectomy  ?-No arm o rarm weakness reported, no incontinence ?A/P: Suspect cervicalgia.  Reports largely normal x-rays other than mild arthritis completed with chiropractor ?- flexeril before bed trial for 2 weeks ?- PT trial as well ?- If no improvement within 2 weeks would want to refer to sports medicine or sooner if symptoms worsen ?-Carotid stenosis typically does not present with pain so we thought this was less likely and no carotid bruits on exam-we will monitor only-the fact that he had a reassuring CT cardiac scoring also points away from this.  With position change relation doubt aortic dissection ? ?# Stress ?S:back in 2002 was on amitriptyline years ago and found this helpful durig a stressful time with his church. Did sleep better and took the edge off. No side effects at that  time.  ? ?Stress has increased lately- busier with church ministry lately and more stressful. Feels not resting as well- but also gets up to urinate and neck has been tighter in last month. Stress has been higher for about a month ?A/P: Stress seems to have amplified since getting home from his trip but also dealing with neck pain-we opted to see if we could address neck pain first but if not improving ?-nortriptyline if not improving over next few weeks with improving neck discomfort- lower side effect profile than amitriptyline but should still help with sleep ? ?# Abnormal CT  chest ?S: Patient has CT cardiac scoring on 08/29/2021 and likely calcium score of 0 was noted.  There was an incidental finding as follows ?"2.4 x 1.0 cm nodular area of architectural distortion in the ?medial aspect of the right lower lobe adjacent to prominent marginal ?osteophytes, favored to represent an area of focal fibrosis ?(reference: Focal pulmonary interstitial opacities adjacent to ?thoracic spine osteophytes. AJR Am J Roentgenol. 2002 ?Oct;179(4):893-6). The possibility of a neoplastic nodule is not ?entirely excluded, however, and accordingly, short-term follow-up ?noncontrast chest CT is recommended in 3 months to ensure the ?stability or regression of this finding. ?2. Patchy ground-glass attenuation in the right lung, likely of ?infectious or inflammatory etiology." ? ?We did use doxycyline as he was having some cough.  ?A/P: cough has resolved  in regards to patchy opacity- this will be rechecked with CT I 1 month to check on nodular area ?  ?#HM- believes colonoscopy over 10 years ago- we were not able to get records- will refer to GI ? ?Recommended follow up: Return for as needed for new, worsening, persistent symptoms. ?Future Appointments  ?Date Time Provider Department Center  ?08/01/2022  9:40 AM Durene Cal, Aldine Contes, MD [facility]  ? ?Lab/Order associations: ?  ICD-10-CM   ?1. Encounter for screening colonoscopy  Z12.11 Ambulatory referral to Gastroenterology  ?  ?2. Cervicalgia  M54.2 Ambulatory referral to Physical Therapy  ?  ?3. Solitary pulmonary nodule  R91.1 CT Chest Wo Contrast  ?  ?4. Stress  F43.9   ?  ? ?Meds ordered this encounter  ?Medications  ? cyclobenzaprine (FLEXERIL) 5 MG tablet  ?  Sig: Take 1 tablet (5 mg total) by mouth at bedtime.  ?  Dispense:  15 tablet  ?  Refill:  0  ? ?Return precautions advised.  ?Tana Conch, MD ? ?

## 2021-10-25 NOTE — Patient Instructions (Addendum)
Sign release of information at the check out desk for [facility] vaccine) ? ?[facility] contact ?Please call to schedule visit and/or procedure ?Address: [address] [place], [place], [place] [address] ?Phone: [phone]  ? ?Suspect cervicalgia.  Reports largely normal x-rays other than mild arthritis completed with chiropractor ?- flexeril before bed trial for 2 weeks ?- PT trial as well- schedule at desk before you leave ?- If no improvement within 2 weeks would want to refer to sports medicine or sooner if symptoms worsen ? ?Stress seems to have amplified since getting home from his trip but also dealing with neck pain-we opted to see if we could address neck pain first but if not improving ?-nortriptyline if not improving over next few weeks with improving neck discomfort ? ?We will call you within two weeks about your referral for Chest Ct- should be in about a month. If you do not hear within 2 weeks, give [place] a call.   ? ? ?Recommended follow up: No follow-ups on file.  ?

## 2021-10-31 ENCOUNTER — Ambulatory Visit (INDEPENDENT_AMBULATORY_CARE_PROVIDER_SITE_OTHER): Payer: BC Managed Care – PPO | Admitting: Family Medicine

## 2021-10-31 ENCOUNTER — Encounter: Payer: Self-pay | Admitting: Family Medicine

## 2021-10-31 VITALS — BP 114/74 | HR 71 | Temp 97.6°F | Ht 72.0 in | Wt 211.8 lb

## 2021-10-31 DIAGNOSIS — M2550 Pain in unspecified joint: Secondary | ICD-10-CM

## 2021-10-31 DIAGNOSIS — R21 Rash and other nonspecific skin eruption: Secondary | ICD-10-CM

## 2021-10-31 DIAGNOSIS — M542 Cervicalgia: Secondary | ICD-10-CM | POA: Diagnosis not present

## 2021-10-31 LAB — CBC WITH DIFFERENTIAL/PLATELET
Basophils Absolute: 0.1 10*3/uL (ref 0.0–0.1)
Basophils Relative: 0.7 % (ref 0.0–3.0)
Eosinophils Absolute: 0.3 10*3/uL (ref 0.0–0.7)
Eosinophils Relative: 3.8 % (ref 0.0–5.0)
HCT: 42.5 % (ref 39.0–52.0)
Hemoglobin: 14.8 g/dL (ref 13.0–17.0)
Lymphocytes Relative: 48.8 % — ABNORMAL HIGH (ref 12.0–46.0)
Lymphs Abs: 3.6 10*3/uL (ref 0.7–4.0)
MCHC: 34.8 g/dL (ref 30.0–36.0)
MCV: 88.3 fl (ref 78.0–100.0)
Monocytes Absolute: 0.7 10*3/uL (ref 0.1–1.0)
Monocytes Relative: 9.5 % (ref 3.0–12.0)
Neutro Abs: 2.7 10*3/uL (ref 1.4–7.7)
Neutrophils Relative %: 37.2 % — ABNORMAL LOW (ref 43.0–77.0)
Platelets: 254 10*3/uL (ref 150.0–400.0)
RBC: 4.82 Mil/uL (ref 4.22–5.81)
RDW: 12.8 % (ref 11.5–15.5)
WBC: 7.3 10*3/uL (ref 4.0–10.5)

## 2021-10-31 LAB — COMPREHENSIVE METABOLIC PANEL
ALT: 14 U/L (ref 0–53)
AST: 17 U/L (ref 0–37)
Albumin: 4.4 g/dL (ref 3.5–5.2)
Alkaline Phosphatase: 88 U/L (ref 39–117)
BUN: 21 mg/dL (ref 6–23)
CO2: 24 mEq/L (ref 19–32)
Calcium: 9.4 mg/dL (ref 8.4–10.5)
Chloride: 106 mEq/L (ref 96–112)
Creatinine, Ser: 0.97 mg/dL (ref 0.40–1.50)
GFR: 83.05 mL/min (ref >60.00)
Glucose, Bld: 87 mg/dL (ref 70–99)
Potassium: 4.1 mEq/L (ref 3.5–5.1)
Sodium: 139 mEq/L (ref 135–145)
Total Bilirubin: 1 mg/dL (ref 0.2–1.2)
Total Protein: 6.7 g/dL (ref 6.0–8.3)

## 2021-10-31 LAB — SEDIMENTATION RATE: Sed Rate: 3 mm/hr (ref 0–20)

## 2021-10-31 LAB — TSH: TSH: 1.04 u[IU]/mL (ref 0.35–5.50)

## 2021-10-31 MED ORDER — PREDNISONE 20 MG PO TABS: 40.0000 mg | ORAL_TABLET | Freq: Every day | ORAL | 0 refills | Status: AC

## 2021-10-31 NOTE — Progress Notes (Signed)
? ?Subjective:  ? ? ? Patient ID: Stephen Sloan, male    DOB: June 08, 1956, 66 y.o.   MRN: [id number] ? ?Chief Complaint  ?Patient presents with  ? Neck Pain  ?  Flexeril did not help.  ? ? ?HPI ?Neck pain-saw Dr. Yong Channel 10/25/21  began around 3/1.  Had been diving in [place] specific injury, but had to turn head quickly as was bumping into a reef-didn't hurt then.  Saw chiro and x-rays some OA.  Given flexeril at [facility] trial and PT.  Has appt in 2 days w/sports med. Flexeril not helping.  Limited ROM neck before feeling pain.  Taking ibu and tylenol. ? ?Rash on back of handL and some on L face-h/o Dengue fever looked similar on legs-got IV in hosp in [place].  No f/c  has been gardening.  Pt R handed ? ?HA back of head-fell in 2005 and sutures back of head.  HA started in March. Dull HA.  ? ?Health Maintenance Due  ?Topic Date Due  ? COLONOSCOPY (Pts 45-70yr Insurance coverage will need to be confirmed)  Never done  ? Zoster Vaccines- Shingrix (1 of 2) Never done  ? ? ?Past Medical History:  ?Diagnosis Date  ? Dengue fever 2012  ? Hypercholesteremia   ? ? ?Past Surgical History:  ?Procedure Laterality Date  ? HERNIA REPAIR    ? as a child  ? PROSTATE BIOPSY    ? around 2015 benign  ? ? ?Outpatient Medications Prior to Visit  ?Medication Sig Dispense Refill  ? acetaminophen (TYLENOL) 325 MG tablet Take 650 mg by mouth every 6 (six) hours as needed for mild pain.    ? cyclobenzaprine (FLEXERIL) 5 MG tablet Take 1 tablet (5 mg total) by mouth at bedtime. 15 tablet 0  ? finasteride (PROSCAR) 5 MG tablet Take 5 mg by mouth daily.    ? ?No facility-administered medications prior to visit.  ? ? ?Allergies  ?Allergen Reactions  ? Levaquin [Levofloxacin] Rash  ?  Suspect but pt unsure, on multiple abx this week  ? Rocephin [Ceftriaxone Sodium In Dextrose] Rash  ?  Suspect this drug, patient unsure as was on multiple abx this week  ? ?ROS neg/noncontributory except as noted HPI/below ?No visual changes.  No confusion. No n/t  arms/weakness. ?Some knee joint pain-chronic but getting worse. ? ? ?   ?Objective:  ?  ? ?BP 114/74   Pulse 71   Temp 97.6 ?F (36.4 ?C)   Ht 6' (1.829 m)   Wt 211 lb 12.8 oz (96.1 kg)   SpO2 98%   BMI 28.73 kg/m?  ?Wt Readings from Last 3 Encounters:  ?10/31/21 211 lb 12.8 oz (96.1 kg)  ?10/25/21 205 lb 12.8 oz (93.4 kg)  ?07/31/21 204 lb 6.4 oz (92.7 kg)  ? ? ?Physical Exam  ? ?Gen: WDWN NAD ?HEENT: NCAT, conjunctiva not injected, sclera nonicteric EOMI.  Twitch below R eye.   ?NECK:  supple but limited rom.  No ttp spine, but muscles and suboccipitals are very tender and SCM, no thyromegaly, no nodes, ?CARDIAC: RRR, S1S2+, no murmur. ?LUNGS: CTAB. No wheezes ?ABDOMEN:  BS+, soft, NTND, No HSM, no masses ?EXT:  no edema ?MSK: no gross abnormalities.  ?NEURO: A&O x3.  CN II-XII intact.  ?PSYCH: normal mood. Good eye contact ?[place] L hand-few nonpalp purple spots approx 5-76meach.  Nothing on palm.  No where else.  Spot on L face is brown(age spot) ? ?   ?Assessment & Plan:  ? ?  Problem List Items Addressed This Visit   ?None ?Visit Diagnoses   ? ? Cervicalgia    -  Primary  ? Relevant Orders  ? Comprehensive metabolic panel  ? CBC with Differential/Platelet  ? TSH  ? Sedimentation rate  ? Rash      ? Relevant Orders  ? Comprehensive metabolic panel  ? CBC with Differential/Platelet  ? TSH  ? Sedimentation rate  ? Arthralgia, unspecified joint      ? Relevant Orders  ? Comprehensive metabolic panel  ? CBC with Differential/Platelet  ? TSH  ? Sedimentation rate  ? ?  ? Cervicalgia-?from turning too fast in March?, doubt infection but will check cbc,esr,crp.  ?arthritis, other.  Will do pred.  Seeing sports med in 2 days-keep appt ?Rash L hand-more purple-?from gardening, new age spots, other-will check cbcd, esr,crp ?Arthralgias-worsening-knees, neck.  Check cbc,esr,cmp,tsh.  Pred.   ? ?Meds ordered this encounter  ?Medications  ? predniSONE (DELTASONE) 20 MG tablet  ?  Sig: Take 2 tablets (40 mg total) by  mouth daily with breakfast for 5 days.  ?  Dispense:  10 tablet  ?  Refill:  0  ? ? ?Wellington Hampshire, MD ? ?

## 2021-10-31 NOTE — Patient Instructions (Signed)
It was very nice to see you today! ? ?Keep appt w/sports med.  Prednisone sent to pharmacy.     ? ? ?PLEASE NOTE: ? ?If you had any lab tests please let us know if you have not heard back within a few days. You may see your results on MyChart before we have a chance to review them but we will give you a call once they are reviewed by [place]. If we ordered any referrals today, please let us know if you have not heard from their office within the next week.  ? ?Please try these tips to maintain a healthy lifestyle: ? ?Eat most of your calories during the day when you are active. Eliminate processed foods including packaged sweets (pies, cakes, cookies), reduce intake of potatoes, white bread, white pasta, and white rice. Look for whole grain options, oat flour or almond flour. ? ?Each meal should contain half fruits/vegetables, one quarter protein, and one quarter carbs (no bigger than a computer mouse). ? ?Cut down on sweet beverages. This includes juice, soda, and sweet tea. Also watch fruit intake, though this is a healthier sweet option, it still contains natural sugar! Limit to 3 servings daily. ? ?Drink at least 1 glass of water with each meal and aim for at least 8 glasses per day ? ?Exercise at least 150 minutes every week.   ?

## 2021-11-01 NOTE — Progress Notes (Signed)
? ?   Stephen Sloan D.Judd Gaudier ?[facility] ?[address] [place] [address] ?Phone: [phone] ?  ?Assessment and Plan:   ?  ?1. Neck pain ?2. DDD (degenerative disc disease), cervical ?-Chronic with exacerbation, initial sports medicine visit ?- Likely acute flare of cervical DDD based on HPI, physical exam, x-ray ?- Patient started on prednisone course on 10/31/2021.  Advised to continue and complete this course ?- Start HEP for neck ?- X-ray obtained in clinic.  My interpretation: No acute fracture or vertebral collapse.  Maintained cervical lordosis.  Large anterior vertebral spurring of inferior portion of C4 ? ?Pertinent previous records reviewed include family medicine note/11/23 ?  ?Follow Up: As needed if no improvement or worsening symptoms in 2 weeks.  Could consider PT versus OMT versus NSAID course ?  ?Subjective:   ?I, Stephen Sloan, am serving as a Neurosurgeon for [employer] ? ?Chief Complaint: neck pain  ? ?HPI:  ?11/02/2021 ?Patient is a 66 year old male complaining of neck pain. Patient states  that he came back from the [place] and wasn't able to turn his neck that was 09/20/2021 the prednisone has helped significantly when he moves his neck in any direction it feels like a sprained ankle, no numbness tingling , does get some tightness to his shoulders and does get some tension headaches in the back of the neck, was doing some free diving and had to make a quick turn and he thinks that may have been it  ? ?Relevant Historical Information: None pertinent ? ?Additional pertinent review of systems negative. ? ? ?Current Outpatient Medications:  ?  cyclobenzaprine (FLEXERIL) 5 MG tablet, Take 1 tablet (5 mg total) by mouth at bedtime., Disp: 15 tablet, Rfl: 0 ?  finasteride (PROSCAR) 5 MG tablet, Take 5 mg by mouth daily., Disp: , Rfl:  ?  predniSONE (DELTASONE) 20 MG tablet, Take 2 tablets (40 mg total) by mouth daily with breakfast for 5 days., Disp: 10 tablet,  Rfl: 0 ?  acetaminophen (TYLENOL) 325 MG tablet, Take 650 mg by mouth every 6 (six) hours as needed for mild pain. (Patient not taking: Reported on 11/02/2021), Disp: , Rfl:   ? ?Objective:   ?  ?Vitals:  ? 11/02/21 1533  ?BP: 132/82  ?Pulse: 78  ?SpO2: 98%  ?Weight: 211 lb (95.7 kg)  ?Height: 6' (1.829 m)  ?  ?  ?Body mass index is 28.62 kg/m?.  ?  ?Physical Exam:   ? ?Cervical Spine: Posture normal ?Skin: normal, intact ? ?Neurological:  ? ?Strength: ? Right  Left   ?Deltoid 5/5 5/5  ?Bicep 5/5  5/5  ?Tricep 5/5 5/5  ?Wrist Flexion 5/5 5/5  ?Wrist Extension 5/5 5/5  ?Grip 5/5 5/5  ?Finger Abduction 5/5 5/5  ? ?Sensation: intact to light touch in upper extremities bilaterally ? ?Spurling's:  negative bilaterally ?Neck ROM: Limited extension, bilateral sidebending and rotation, though worse left versus right.  Flexion maintained ?TTP: Mildly cervical paraspinal ?NTTP:   cervical spinous processes, thoracic paraspinal, trapezius  ? ? ?Electronically signed by:  ?Stephen Sloan D.Judd Gaudier ? Sports Medicine ?4:14 PM 11/02/21 ?

## 2021-11-02 ENCOUNTER — Ambulatory Visit (INDEPENDENT_AMBULATORY_CARE_PROVIDER_SITE_OTHER): Payer: BC Managed Care – PPO

## 2021-11-02 ENCOUNTER — Ambulatory Visit (INDEPENDENT_AMBULATORY_CARE_PROVIDER_SITE_OTHER): Payer: BC Managed Care – PPO | Admitting: Sports Medicine

## 2021-11-02 VITALS — BP 132/82 | HR 78 | Ht 72.0 in | Wt 211.0 lb

## 2021-11-02 DIAGNOSIS — M542 Cervicalgia: Secondary | ICD-10-CM

## 2021-11-02 DIAGNOSIS — M5030 Other cervical disc degeneration, unspecified cervical region: Secondary | ICD-10-CM

## 2021-11-02 NOTE — Patient Instructions (Addendum)
Good to see you  ?Neck HEP ?Complete prednisone course  ?As needed follow up if no improvement 2  week follow up  ? ?

## 2021-12-22 ENCOUNTER — Ambulatory Visit (INDEPENDENT_AMBULATORY_CARE_PROVIDER_SITE_OTHER)
Admission: RE | Admit: 2021-12-22 | Discharge: 2021-12-22 | Disposition: A | Discharge status: Home / Self Care | Payer: BC Managed Care – PPO | Source: Ambulatory Visit | Attending: Family Medicine | Admitting: Family Medicine

## 2021-12-22 DIAGNOSIS — R911 Solitary pulmonary nodule: Secondary | ICD-10-CM | POA: Diagnosis not present

## 2021-12-22 DIAGNOSIS — R918 Other nonspecific abnormal finding of lung field: Secondary | ICD-10-CM | POA: Diagnosis not present

## 2021-12-24 ENCOUNTER — Encounter: Payer: Self-pay | Admitting: Family Medicine

## 2021-12-25 MED ORDER — NORTRIPTYLINE HCL 10 MG PO CAPS: 10.0000 mg | ORAL_CAPSULE | Freq: Every day | ORAL | 5 refills | Status: DC

## 2022-01-10 ENCOUNTER — Encounter: Payer: Self-pay | Admitting: Physician Assistant

## 2022-01-19 ENCOUNTER — Other Ambulatory Visit: Payer: Self-pay | Admitting: Family Medicine

## 2022-02-09 NOTE — Progress Notes (Unsigned)
02/12/2022 Stephen Sloan [id number] 1955/07/25  Referring provider: Shelva Majestic, MD Primary GI doctor: Dr. Lavon Paganini  ASSESSMENT AND PLAN:   Screen for colon cancer Appears had normal colon 06/2022 with [facility] Sprang, will try to get records. No issues today, no family history.  Will plan for nurse to call back to schedule for colonoscopy after 07/10/2022     Patient Care Team: Shelva Majestic, MD as PCP - General (Family Medicine)  HISTORY OF PRESENT ILLNESS: 66 y.o. male with a past medical history of BPH and others listed below presents for evaluation of screening colonoscopy.   Was able to see in care everywhere that patient had colonoscopy at [facility] 07/10/2012, per patient was normal, no polyps, Will try to get records.  Patient is not on blood thinners.  He is on ibuprofen 200mg , 2-3 tablets daily. No ETOH.  Patient does not complain of GERD.  Patient denies dysphagia, nausea, vomiting, melena.  Patient has a BM every day, no straining.  Patient denies change in bowel habits, constipation, diarrhea, hematochezia.  Denies changes in appetite, unintentional weight loss.   Patient denies  family history of colon cancer or other gastrointestinal malignancies. CBC on 10/31/2021  WBC 7.3 HGB 14.8 MCV 88.3 Platelets 254.0   Current Medications:      Current Outpatient Medications (Analgesics):    acetaminophen (TYLENOL) 325 MG tablet, Take 650 mg by mouth every 6 (six) hours as needed for mild pain.   Current Outpatient Medications (Other):    finasteride (PROSCAR) 5 MG tablet, Take 5 mg by mouth daily.   nortriptyline (PAMELOR) 10 MG capsule, TAKE 1 CAPSULE BY MOUTH AT BEDTIME.  Medical History:  Past Medical History:  Diagnosis Date   Anxiety    Dengue fever 07/23/2010   Enlarged prostate    Hypercholesteremia    Allergies:  Allergies  Allergen Reactions   Levaquin [Levofloxacin] Rash    Suspect but pt unsure, on multiple abx this week   Rocephin  [Ceftriaxone Sodium In Dextrose] Rash    Suspect this drug, patient unsure as was on multiple abx this week     Surgical History:  He  has a past surgical history that includes Hernia repair (1960) and Prostate biopsy. Family History:  His family history includes Arthritis in his sister; CAD in his father and paternal grandfather; Heart attack in his father and paternal grandfather; Hyperlipidemia in his father; Non-Hodgkin's lymphoma in his father; Obesity in his brother and sister; Ovarian cancer in his mother; Prostatitis in his father. Social History:   reports that he has never smoked. He has never used smokeless tobacco. He reports that he does not drink alcohol and does not use drugs.  REVIEW OF SYSTEMS  : All other systems reviewed and negative except where noted in the History of Present Illness.   PHYSICAL EXAM: BP 130/74 (BP Location: Left Arm, Patient Position: Sitting, Cuff Size: Normal)   Pulse 72   Ht 5' 11.75" (1.822 m) Comment: height measured without shoes  Wt 212 lb 2 oz (96.2 kg)   BMI 28.97 kg/m  General:   Pleasant, well developed male in no acute distress Head:   Normocephalic and atraumatic. Eyes:  sclerae anicteric,conjunctive pink  Heart:   regular rate and rhythm Pulm:  Clear anteriorly; no wheezing Abdomen:   Soft, Obese AB, Active bowel sounds. No tenderness . , No organomegaly appreciated. Rectal: Not evaluated Extremities:  Without edema. Msk: Symmetrical without gross deformities. Peripheral pulses intact.  Neurologic:  Alert and  oriented x4;  No focal deficits.  Skin:   Dry and intact without significant lesions or rashes. Psychiatric:  Cooperative. Normal mood and affect.    Doree Albee, PA-C 2:56 PM

## 2022-02-12 ENCOUNTER — Encounter: Payer: Self-pay | Admitting: Physician Assistant

## 2022-02-12 ENCOUNTER — Ambulatory Visit (INDEPENDENT_AMBULATORY_CARE_PROVIDER_SITE_OTHER): Payer: BC Managed Care – PPO | Admitting: Physician Assistant

## 2022-02-12 VITALS — BP 130/74 | HR 72 | Ht 71.75 in | Wt 212.1 lb

## 2022-02-12 DIAGNOSIS — Z1211 Encounter for screening for malignant neoplasm of colon: Secondary | ICD-10-CM

## 2022-02-12 NOTE — Patient Instructions (Addendum)
If you are age 64 or younger, your body mass index should be between 19-25. Your Body mass index is 28.97 kg/m. If this is out of the aformentioned range listed, please consider follow up with your Primary Care Provider.  ________________________________________________________  The [facility] providers would like to encourage you to use [facility] to communicate with providers for non-urgent requests or questions.  Due to long hold times on the telephone, sending your provider a message by [facility] may be a faster and more efficient way to get a response.  Please allow 48 business hours for a response.  Please remember that this is for non-urgent requests.  _______________________________________________________  Bonita Quin will be due for a colonoscopy in December 2023. We will contact you to schedule this appointment.  We will attempt to obtain records from colonoscopy in December 2013 at [facility].  Thank you for entrusting me with your care and choosing [facility].  Quentin Mulling, PA-C

## 2022-04-16 ENCOUNTER — Encounter: Payer: Self-pay | Admitting: *Deleted

## 2022-05-11 DIAGNOSIS — R3915 Urgency of urination: Secondary | ICD-10-CM | POA: Diagnosis not present

## 2022-05-11 DIAGNOSIS — N401 Enlarged prostate with lower urinary tract symptoms: Secondary | ICD-10-CM | POA: Diagnosis not present

## 2022-05-18 DIAGNOSIS — R972 Elevated prostate specific antigen [PSA]: Secondary | ICD-10-CM | POA: Diagnosis not present

## 2022-05-18 DIAGNOSIS — R3915 Urgency of urination: Secondary | ICD-10-CM | POA: Diagnosis not present

## 2022-07-05 ENCOUNTER — Encounter: Payer: Self-pay | Admitting: *Deleted

## 2022-08-01 ENCOUNTER — Encounter: Payer: Self-pay | Admitting: Family Medicine

## 2022-08-01 ENCOUNTER — Ambulatory Visit (INDEPENDENT_AMBULATORY_CARE_PROVIDER_SITE_OTHER): Payer: BC Managed Care – PPO | Admitting: Family Medicine

## 2022-08-01 VITALS — BP 122/68 | HR 66 | Temp 98.3°F | Ht 71.75 in | Wt 213.4 lb

## 2022-08-01 DIAGNOSIS — N401 Enlarged prostate with lower urinary tract symptoms: Secondary | ICD-10-CM

## 2022-08-01 DIAGNOSIS — Z0000 Encounter for general adult medical examination without abnormal findings: Secondary | ICD-10-CM

## 2022-08-01 DIAGNOSIS — E785 Hyperlipidemia, unspecified: Secondary | ICD-10-CM | POA: Diagnosis not present

## 2022-08-01 DIAGNOSIS — R911 Solitary pulmonary nodule: Secondary | ICD-10-CM

## 2022-08-01 DIAGNOSIS — Z125 Encounter for screening for malignant neoplasm of prostate: Secondary | ICD-10-CM | POA: Diagnosis not present

## 2022-08-01 DIAGNOSIS — R351 Nocturia: Secondary | ICD-10-CM

## 2022-08-01 DIAGNOSIS — J8410 Pulmonary fibrosis, unspecified: Secondary | ICD-10-CM

## 2022-08-01 LAB — LIPID PANEL
Cholesterol: 236 mg/dL — ABNORMAL HIGH (ref 0–200)
HDL: 59.5 mg/dL (ref >39.00)
LDL Cholesterol: 151 mg/dL — ABNORMAL HIGH (ref 0–99)
NonHDL: 176.11
Total CHOL/HDL Ratio: 4
Triglycerides: 126 mg/dL (ref 0.0–149.0)
VLDL: 25.2 mg/dL (ref 0.0–40.0)

## 2022-08-01 LAB — CBC WITH DIFFERENTIAL/PLATELET
Basophils Absolute: 0.1 10*3/uL (ref 0.0–0.1)
Basophils Relative: 0.9 % (ref 0.0–3.0)
Eosinophils Absolute: 0.2 10*3/uL (ref 0.0–0.7)
Eosinophils Relative: 2.2 % (ref 0.0–5.0)
HCT: 45.8 % (ref 39.0–52.0)
Hemoglobin: 15.7 g/dL (ref 13.0–17.0)
Lymphocytes Relative: 34.1 % (ref 12.0–46.0)
Lymphs Abs: 2.9 10*3/uL (ref 0.7–4.0)
MCHC: 34.3 g/dL (ref 30.0–36.0)
MCV: 89.8 fl (ref 78.0–100.0)
Monocytes Absolute: 0.8 10*3/uL (ref 0.1–1.0)
Monocytes Relative: 9.9 % (ref 3.0–12.0)
Neutro Abs: 4.5 10*3/uL (ref 1.4–7.7)
Neutrophils Relative %: 52.9 % (ref 43.0–77.0)
Platelets: 308 10*3/uL (ref 150.0–400.0)
RBC: 5.1 Mil/uL (ref 4.22–5.81)
RDW: 12.9 % (ref 11.5–15.5)
WBC: 8.5 10*3/uL (ref 4.0–10.5)

## 2022-08-01 LAB — COMPREHENSIVE METABOLIC PANEL
ALT: 13 U/L (ref 0–53)
AST: 20 U/L (ref 0–37)
Albumin: 4.6 g/dL (ref 3.5–5.2)
Alkaline Phosphatase: 90 U/L (ref 39–117)
BUN: 18 mg/dL (ref 6–23)
CO2: 26 mEq/L (ref 19–32)
Calcium: 9.5 mg/dL (ref 8.4–10.5)
Chloride: 105 mEq/L (ref 96–112)
Creatinine, Ser: 1.11 mg/dL (ref 0.40–1.50)
GFR: 70.28 mL/min (ref >60.00)
Glucose, Bld: 94 mg/dL (ref 70–99)
Potassium: 5.3 mEq/L — ABNORMAL HIGH (ref 3.5–5.1)
Sodium: 141 mEq/L (ref 135–145)
Total Bilirubin: 1.3 mg/dL — ABNORMAL HIGH (ref 0.2–1.2)
Total Protein: 7.2 g/dL (ref 6.0–8.3)

## 2022-08-01 MED ORDER — NORTRIPTYLINE HCL 10 MG PO CAPS: 10.0000 mg | ORAL_CAPSULE | Freq: Every day | ORAL | 3 refills | Status: DC

## 2022-08-01 NOTE — Patient Instructions (Addendum)
[facility] contact Please call to schedule visit and/or procedure Address: [address], [place], [place] [address] Phone: [phone]   We will call you within two weeks about your referral to CT chest through [facility].  Their phone number is [phone].  Please call them if you have not heard in 1-2 weeks  # Ear eczema-he will trial hydrocortisone over the counter twice a day for 7 days-if fails to improve I can send in triamcinolone  Please stop by lab before you go If you have mychart- we will send your results within 3 business days of [place] receiving them.  If you do not have mychart- we will call you about results within 5 business days of [place] receiving them.  *please also note that you will see labs on mychart as soon as they post. I will later go in and write notes on them- will say "notes from Dr. Yong Channel"   Recommended follow up: Return in about 1 year (around 08/02/2023) for physical or sooner if needed.Schedule b4 you leave.

## 2022-08-01 NOTE — Progress Notes (Signed)
Phone: [phone]   Subjective:  Patient presents today for their annual physical. Chief complaint-noted.   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   Enlarged prostate    Hypercholesteremia    Patient Active Problem List   Diagnosis Date Noted   BPH associated with nocturia 07/31/2021    Priority: Medium    Dengue fever     Priority: Low   Past Surgical History:  Procedure Laterality Date   HERNIA REPAIR  1960   double hernia, as a child   PROSTATE BIOPSY     around 2015 benign    Family History  Problem Relation Age of Onset   Ovarian cancer Mother    CAD Father        heart attack in 30s, ongoing angina   Non-Hodgkin's lymphoma Father        stage III at 45   Hyperlipidemia Father        overweight   Heart attack Father    Prostatitis Father    Obesity Sister    Arthritis Sister        hips and knees   Obesity Brother    CAD Paternal Grandfather        MI age 68   Heart attack Paternal Grandfather    Medications- reviewed and updated Current Outpatient Medications  Medication Sig Dispense Refill   acetaminophen (TYLENOL) 325 MG tablet Take 650 mg by mouth every 6 (six) hours as needed for mild pain.     finasteride (PROSCAR) 5 MG tablet Take 5 mg by mouth daily.     nortriptyline (PAMELOR) 10 MG capsule TAKE 1 CAPSULE BY MOUTH AT BEDTIME. 90 capsule 2   No current facility-administered medications for this visit.    Allergies-reviewed and updated Allergies  Allergen Reactions   Levaquin [Levofloxacin] Rash    Suspect but pt unsure, on multiple abx this week   Rocephin [Ceftriaxone Sodium In Dextrose] Rash    Suspect this drug, patient unsure as was on multiple abx this week     Social History   Social History Narrative   Married. 4 kids. 13 grandkids.       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       Hobbies: enjoys jogging around 5k, travel      Objective  Objective:  BP 122/68 (BP Location: Left Arm, Patient Position: Sitting)   Pulse 66   Temp 98.3 F (36.8 C) (Temporal)   Ht 5' 11.75" (1.822 m)   Wt 213 lb 6.4 oz (96.8 kg)   SpO2 98%   BMI 29.14 kg/m  Gen: NAD, resting comfortably HEENT: Mucous membranes are moist. Oropharynx normal. TM normal but in right ear canal some mild dryness and scaling Neck: no thyromegaly CV: RRR no murmurs rubs or gallops Lungs: CTAB no crackles, wheeze, rhonchi Abdomen: soft/nontender/nondistended/normal bowel sounds. No rebound or guarding.  Ext: no edema Skin: warm, dry Neuro: grossly normal, moves all extremities, PERRLA    Assessment and Plan  67 y.o. male presenting for annual physical.  Health Maintenance counseling: 1. Anticipatory guidance: Patient counseled regarding regular dental exams -q6 months, eye exams -yearly,  avoiding smoking and second hand smoke, limiting alcohol to 2 beverages per day,  no illicit drugs.   2. Risk factor reduction:  Advised patient of need for regular exercise and diet rich and fruits and vegetables to reduce risk of heart attack and stroke.  Exercise-runs about 5K distance 1 or 2 times a week with some additional walking- has maintained . [place] next month and planning to increase exercise Diet/weight management-high school weight around 170 and typically in adult life 1 95-200-up 9 pounds from last year- he is trying to do low carb- has been helpful in the past- had lost 35 lbs when 67 years old .  Wt Readings from Last 3 Encounters:  08/01/22 213 lb 6.4 oz (96.8 kg)  02/12/22 212 lb 2 oz (96.2 kg)  11/02/21 211 lb (95.7 kg)  3. Immunizations/screenings/ancillary studies-declines flu and COVID shot.  Discussed Shingrix-already had this- we  requested records but were not sent immunizatoins.   Immunization History  Administered Date(s) Administered   Janssen (J&J) SARS-COV-2 Vaccination 11/23/2019   Tdap 12/23/2015  4. Prostate cancer screening-  has BPH followed with Dr. Tresa Moore- on long term finasteride- they check his PSAs. Biopsy around 2015 benign 5. Colon cancer screening -last colonoscopy 2013 with [facility]-has already had initial visit with [facility]-encouraged him to call back to schedule  6. Skin cancer screening- last derm visit 6 years ago in [place] - not interested at moments- waist up exam today. advised regular sunscreen use. Denies worrisome, changing, or new skin lesions- epidermoid cyst on left chest- able to express cottage cheese like discharge.  7. Smoking associated screening (lung cancer screening, AAA screen 65-75, UA)- Never smoker 8. 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 Tresa Moore S:medication: finasteride 5 mg  A/P: stable- continue current medicines    #hyperlipidemia but with CT calcium scoring of 0 in 2023 despite family history S: Medication: none  Lab Results  Component Value Date   CHOL 197 07/31/2021   HDL 41.70 07/31/2021   LDLCALC 132 (H) 07/31/2021   TRIG 116.0 07/31/2021   CHOLHDL 5 07/31/2021   A/P: lipids above ideal goal but with ct calcium scoring at 0 prefer to work on diet/exercise and recheck 5-10 years perhaps  # Stress management-amitriptyline helpful in 2002 with work/church related stress and without side effects.  Opted in 2023 to trial nortriptyline 10 mg due to lower side effect profile and has been helpful   # Ear eczema-he will trial hydrocortisone over the counter twice a day for 7  days-if fails to improve I can send in triamcinolone  #Neck stiffness-prednisone helped some. Some mild right neck tenderness if turns too fast or too far. Tries to continue home exercises. Using a blue foam neck extender in the morning for 10 minutes with heat.  - wants to monitor for now   Recommended follow up: Return in about 1 year (around 08/02/2023) for physical or sooner if needed.Schedule b4 you leave. No future appointments.  Lab/Order associations: NOT fasting-coffee with creamer   ICD-10-CM   1. Preventative health care  Z00.00     2. Hyperlipidemia, unspecified hyperlipidemia type  E78.5     3. BPH associated with nocturia  N40.1    R35.1     4. Screening for prostate  cancer  Z12.5     5. Solitary pulmonary nodule  R91.1     6. 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  Return precautions advised.  Garret Reddish, MD

## 2022-08-30 ENCOUNTER — Ambulatory Visit
Admission: RE | Admit: 2022-08-30 | Discharge: 2022-08-30 | Disposition: A | Discharge status: Home / Self Care | Payer: BC Managed Care – PPO | Source: Ambulatory Visit | Attending: Family Medicine | Admitting: Family Medicine

## 2022-08-30 DIAGNOSIS — J984 Other disorders of lung: Secondary | ICD-10-CM | POA: Diagnosis not present

## 2022-08-30 DIAGNOSIS — R911 Solitary pulmonary nodule: Secondary | ICD-10-CM

## 2022-08-30 DIAGNOSIS — M2578 Osteophyte, vertebrae: Secondary | ICD-10-CM | POA: Diagnosis not present

## 2022-08-30 DIAGNOSIS — J8410 Pulmonary fibrosis, unspecified: Secondary | ICD-10-CM | POA: Diagnosis not present

## 2022-09-03 ENCOUNTER — Encounter: Payer: Self-pay | Admitting: Gastroenterology

## 2022-10-03 DIAGNOSIS — E785 Hyperlipidemia, unspecified: Secondary | ICD-10-CM | POA: Insufficient documentation

## 2022-10-03 DIAGNOSIS — G4700 Insomnia, unspecified: Secondary | ICD-10-CM | POA: Insufficient documentation

## 2022-10-12 ENCOUNTER — Other Ambulatory Visit: Payer: Self-pay | Admitting: Family Medicine

## 2022-10-17 ENCOUNTER — Encounter: Payer: Self-pay | Admitting: Gastroenterology

## 2022-10-17 ENCOUNTER — Ambulatory Visit (AMBULATORY_SURGERY_CENTER): Payer: BC Managed Care – PPO

## 2022-10-17 VITALS — Ht 71.75 in | Wt 208.0 lb

## 2022-10-17 DIAGNOSIS — Z1211 Encounter for screening for malignant neoplasm of colon: Secondary | ICD-10-CM

## 2022-10-17 MED ORDER — NA SULFATE-K SULFATE-MG SULF 17.5-3.13-1.6 GM/177ML PO SOLN: 1.0000 | Freq: Once | ORAL | 0 refills | Status: AC

## 2022-10-17 NOTE — Progress Notes (Signed)

## 2022-11-02 ENCOUNTER — Encounter: Payer: Self-pay | Admitting: Gastroenterology

## 2022-11-02 ENCOUNTER — Ambulatory Visit (AMBULATORY_SURGERY_CENTER): Payer: BC Managed Care – PPO | Admitting: Gastroenterology

## 2022-11-02 VITALS — BP 112/64 | HR 62 | Temp 97.1°F | Resp 12 | Ht 71.75 in | Wt 208.0 lb

## 2022-11-02 DIAGNOSIS — D122 Benign neoplasm of ascending colon: Secondary | ICD-10-CM | POA: Diagnosis not present

## 2022-11-02 DIAGNOSIS — Z1211 Encounter for screening for malignant neoplasm of colon: Secondary | ICD-10-CM

## 2022-11-02 DIAGNOSIS — D123 Benign neoplasm of transverse colon: Secondary | ICD-10-CM

## 2022-11-02 DIAGNOSIS — K635 Polyp of colon: Secondary | ICD-10-CM | POA: Diagnosis not present

## 2022-11-02 MED ORDER — SODIUM CHLORIDE 0.9 % IV SOLN: 500.0000 mL | INTRAVENOUS | Status: DC

## 2022-11-02 NOTE — Progress Notes (Signed)
[facility] History and Physical   Primary Care Physician:  Shelva Majestic, MD   Reason for Procedure:  Colorectal cancer screening  Plan:    Screening colonoscopy with possible interventions as needed     HPI: Stephen Sloan is a very pleasant 67 y.o. male here for screening colonoscopy. Denies any nausea, vomiting, abdominal pain, melena or bright red blood per rectum  The risks and benefits as well as alternatives of endoscopic procedure(s) have been discussed and reviewed. All questions answered. The patient agrees to proceed.    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 Authorizing Provider  acetaminophen (TYLENOL) 325 MG tablet Take 650 mg by mouth every 6 (six) hours as needed for mild pain.   Yes [provider]  finasteride (PROSCAR) 5 MG tablet Take 5 mg by mouth daily. 05/26/21  Yes [provider]  naproxen (NAPROSYN) 250 MG tablet Take 250 mg by mouth as needed.   Yes [provider]  nortriptyline (PAMELOR) 10 MG capsule TAKE 1 CAPSULE BY MOUTH EVERYDAY AT BEDTIME 10/12/22  Yes Shelva Majestic, MD  aspirin EC 81 MG tablet Take 81 mg by mouth once.    [provider]    Current Outpatient Medications  Medication Sig Dispense Refill   acetaminophen (TYLENOL) 325 MG tablet Take 650 mg by mouth every 6 (six) hours as needed for mild pain.     finasteride (PROSCAR) 5 MG tablet Take 5 mg by mouth daily.     naproxen (NAPROSYN) 250 MG tablet Take 250 mg by mouth as needed.     nortriptyline (PAMELOR) 10 MG capsule TAKE 1 CAPSULE BY MOUTH EVERYDAY AT BEDTIME 90 capsule 2   aspirin EC 81 MG tablet Take  81 mg by mouth once.     Current Facility-Administered Medications  Medication Dose Route Frequency Provider Last Rate Last Admin   0.9 %  sodium chloride infusion  500 mL Intravenous Continuous Harmoney Sienkiewicz, Eleonore Chiquito, MD        Allergies as of 11/02/2022 - Review Complete 11/02/2022  Allergen Reaction Noted   Levaquin [levofloxacin] Rash 11/14/2013   Rocephin [ceftriaxone sodium in dextrose] Rash 11/14/2013    Family History  Problem Relation Age of Onset   Ovarian cancer Mother    CAD Father        heart attack in 30s, ongoing angina   Non-Hodgkin's lymphoma Father        stage III at 53   Hyperlipidemia Father        overweight   Heart attack Father    Prostatitis Father    Obesity Sister    Arthritis Sister        hips and knees   Obesity Brother    Stomach cancer Paternal Uncle    CAD Paternal Grandfather        MI age 26   Heart attack Paternal Grandfather    Colon cancer Neg Hx    Colon polyps Neg Hx    Esophageal cancer Neg Hx    Rectal cancer Neg Hx  Social History   Socioeconomic History   Marital status: Married    Spouse name: Not on file   Number of children: 4   Years of education: Not on file   Highest education level: Not on file  Occupational History   Occupation: Minister  Tobacco Use   Smoking status: Never   Smokeless tobacco: Never  Vaping Use   Vaping Use: Never used  Substance and Sexual Activity   Alcohol use: No   Drug use: No   Sexual activity: Yes    Partners: Female  Other Topics Concern   Not on file  Social History Narrative   Married. 4 kids. 13 grandkids.       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       Hobbies: enjoys jogging around [employer], travel      Social Determinants of Corporate investment banker Strain: Not on file  Food Insecurity: Not on file  Transportation Needs: Not on file  Physical Activity: Not on file  Stress: Not on file  Social Connections: Not on file  Intimate Partner  Violence: Not on file    Review of Systems:  All other review of systems negative except as mentioned in the HPI.  Physical Exam: Vital signs in last 24 hours: Blood Pressure (Abnormal) 121/90   Pulse 74   Temperature (Abnormal) 97.1 F (36.2 C)   Height 5' 11.75" (1.822 m)   Weight 208 lb (94.3 kg)   Oxygen Saturation 96%   Body Mass Index 28.41 kg/m  General:   Alert, NAD Lungs:  Clear .   Heart:  Regular rate and rhythm Abdomen:  Soft, nontender and nondistended. Neuro/Psych:  Alert and cooperative. Normal mood and affect. A and O x 3  Reviewed labs, radiology imaging, old records and pertinent past GI work up  Patient is appropriate for planned procedure(s) and anesthesia in an ambulatory setting   K. Scherry Ran , MD [phone]

## 2022-11-02 NOTE — Op Note (Signed)
[facility] Patient Name: Stephen Sloan Procedure Date: 11/02/2022 9:27 AM MRN: [id number] Endoscopist: Napoleon Form , MD, [id number] Age: 67 Referring MD:  Date of Birth: 1956/03/03 Gender: Male Account #: [id number] Procedure:                Colonoscopy Indications:              Screening for colorectal malignant neoplasm Medicines:                Monitored Anesthesia Care Procedure:                Pre-Anesthesia Assessment:                           - Prior to the procedure, a History and Physical                            was performed, and patient medications and                            allergies were reviewed. The patient's tolerance of                            previous anesthesia was also reviewed. The risks                            and benefits of the procedure and the sedation                            options and risks were discussed with the patient.                            All questions were answered, and informed consent                            was obtained. Prior Anticoagulants: The patient has                            taken no anticoagulant or antiplatelet agents. ASA                            Grade Assessment: II - A patient with mild systemic                            disease. After reviewing the risks and benefits,                            the patient was deemed in satisfactory condition to                            undergo the procedure.                           After obtaining informed consent, the colonoscope  was passed under direct vision. Throughout the                            procedure, the patient's blood pressure, pulse, and                            oxygen saturations were monitored continuously. The                            Olympus PCF-H190DL [id number]) Colonoscope was                            introduced through the anus and advanced to the the                            cecum,  identified by appendiceal orifice and                            ileocecal valve. The colonoscopy was performed                            without difficulty. The patient tolerated the                            procedure well. The quality of the bowel                            preparation was good. The ileocecal valve,                            appendiceal orifice, and rectum were photographed. Scope In: 9:41:03 AM Scope Out: 9:59:07 AM Scope Withdrawal Time: 0 hours 13 minutes 33 seconds  Total Procedure Duration: 0 hours 18 minutes 4 seconds  Findings:                 The perianal and digital rectal examinations were                            normal.                           Three sessile polyps were found in the transverse                            colon and ascending colon. The polyps were 4 to 10                            mm in size. These polyps were removed with a cold                            snare. Resection and retrieval were complete.                           Non-bleeding external and internal hemorrhoids were  found during retroflexion. The hemorrhoids were                            medium-sized. Complications:            No immediate complications. Estimated Blood Loss:     Estimated blood loss was minimal. Impression:               - Three 4 to 10 mm polyps in the transverse colon                            and in the ascending colon, removed with a cold                            snare. Resected and retrieved.                           - Non-bleeding external and internal hemorrhoids. Recommendation:           - Patient has a contact number available for                            emergencies. The signs and symptoms of potential                            delayed complications were discussed with the                            patient. Return to normal activities tomorrow.                            Written discharge instructions were  provided to the                            patient.                           - Resume previous diet.                           - Continue present medications.                           - Await pathology results.                           - Repeat colonoscopy in 3 - 5 years for                            surveillance based on pathology results. Napoleon Form, MD 11/02/2022 10:12:26 AM This report has been signed electronically.

## 2022-11-02 NOTE — Patient Instructions (Addendum)
Handouts Provided:  Polyps  Repeat colonoscopy 3-5 years for surveillance based on pathology results.   YOU HAD AN ENDOSCOPIC PROCEDURE TODAY AT THE [facility]:   Refer to the procedure report that was given to you for any specific questions about what was found during the examination.  If the procedure report does not answer your questions, please call your gastroenterologist to clarify.  If you requested that your care partner not be given the details of your procedure findings, then the procedure report has been included in a sealed envelope for you to review at your convenience later.  YOU SHOULD EXPECT: Some feelings of bloating in the abdomen. Passage of more gas than usual.  Walking can help get rid of the air that was put into your GI tract during the procedure and reduce the bloating. If you had a lower endoscopy (such as a colonoscopy or flexible sigmoidoscopy) you may notice spotting of blood in your stool or on the toilet paper. If you underwent a bowel prep for your procedure, you may not have a normal bowel movement for a few days.  Please Note:  You might notice some irritation and congestion in your nose or some drainage.  This is from the oxygen used during your procedure.  There is no need for concern and it should clear up in a day or so.  SYMPTOMS TO REPORT IMMEDIATELY:  Following lower endoscopy (colonoscopy or flexible sigmoidoscopy):  Excessive amounts of blood in the stool  Significant tenderness or worsening of abdominal pains  Swelling of the abdomen that is new, acute  Fever of 100F or higher  For urgent or emergent issues, a gastroenterologist can be reached at any hour by calling [phone]. Do not use MyChart messaging for urgent concerns.    DIET:  We do recommend a small meal at first, but then you may proceed to your regular diet.  Drink plenty of fluids but you should avoid alcoholic beverages for 24 hours.  ACTIVITY:  You should plan to  take it easy for the rest of today and you should NOT DRIVE or use heavy machinery until tomorrow (because of the sedation medicines used during the test).    FOLLOW UP: Our staff will call the number listed on your records the next business day following your procedure.  We will call around 7:15- 8:00 am to check on you and address any questions or concerns that you may have regarding the information given to you following your procedure. If we do not reach you, we will leave a message.     If any biopsies were taken you will be contacted by phone or by letter within the next 1-3 weeks.  Please call us at [phone] if you have not heard about the biopsies in 3 weeks.    SIGNATURES/CONFIDENTIALITY: You and/or your care partner have signed paperwork which will be entered into your electronic medical record.  These signatures attest to the fact that that the information above on your After Visit Summary has been reviewed and is understood.  Full responsibility of the confidentiality of this discharge information lies with you and/or your care-partner.

## 2022-11-02 NOTE — Progress Notes (Signed)
Pt's states no medical or surgical changes since previsit or office visit. 

## 2022-11-02 NOTE — Progress Notes (Signed)
Called to room to assist during endoscopic procedure.  Patient ID and intended procedure confirmed with present staff. Received instructions for my participation in the procedure from the performing physician.  

## 2022-11-02 NOTE — Progress Notes (Signed)
Report to PACU, RN, vss, BBS= Clear.  

## 2022-11-05 ENCOUNTER — Telehealth: Payer: Self-pay

## 2022-11-05 NOTE — Telephone Encounter (Signed)
Left message on follow up call. 

## 2022-11-13 ENCOUNTER — Encounter: Payer: Self-pay | Admitting: Gastroenterology

## 2023-01-14 DIAGNOSIS — N401 Enlarged prostate with lower urinary tract symptoms: Secondary | ICD-10-CM | POA: Diagnosis not present

## 2023-01-14 DIAGNOSIS — R3915 Urgency of urination: Secondary | ICD-10-CM | POA: Diagnosis not present

## 2023-01-14 DIAGNOSIS — R310 Gross hematuria: Secondary | ICD-10-CM | POA: Diagnosis not present

## 2023-02-12 DIAGNOSIS — R310 Gross hematuria: Secondary | ICD-10-CM | POA: Diagnosis not present

## 2023-02-12 DIAGNOSIS — N200 Calculus of kidney: Secondary | ICD-10-CM | POA: Diagnosis not present

## 2023-02-12 DIAGNOSIS — N400 Enlarged prostate without lower urinary tract symptoms: Secondary | ICD-10-CM | POA: Diagnosis not present

## 2023-02-26 DIAGNOSIS — R310 Gross hematuria: Secondary | ICD-10-CM | POA: Diagnosis not present

## 2023-02-26 DIAGNOSIS — R3915 Urgency of urination: Secondary | ICD-10-CM | POA: Diagnosis not present

## 2023-02-26 DIAGNOSIS — N5201 Erectile dysfunction due to arterial insufficiency: Secondary | ICD-10-CM | POA: Diagnosis not present

## 2023-02-26 DIAGNOSIS — R972 Elevated prostate specific antigen [PSA]: Secondary | ICD-10-CM | POA: Diagnosis not present

## 2023-08-08 ENCOUNTER — Encounter: Payer: Self-pay | Admitting: Family Medicine

## 2023-08-08 ENCOUNTER — Ambulatory Visit (INDEPENDENT_AMBULATORY_CARE_PROVIDER_SITE_OTHER): Payer: Medicare Other | Admitting: Family Medicine

## 2023-08-08 VITALS — BP 112/80 | HR 71 | Temp 97.0°F | Ht 71.5 in | Wt 210.2 lb

## 2023-08-08 DIAGNOSIS — E785 Hyperlipidemia, unspecified: Secondary | ICD-10-CM | POA: Diagnosis not present

## 2023-08-08 DIAGNOSIS — Z0000 Encounter for general adult medical examination without abnormal findings: Secondary | ICD-10-CM | POA: Diagnosis not present

## 2023-08-08 DIAGNOSIS — E663 Overweight: Secondary | ICD-10-CM | POA: Diagnosis not present

## 2023-08-08 DIAGNOSIS — Z131 Encounter for screening for diabetes mellitus: Secondary | ICD-10-CM

## 2023-08-08 LAB — CBC WITH DIFFERENTIAL/PLATELET
Basophils Absolute: 0.1 10*3/uL (ref 0.0–0.1)
Basophils Relative: 1 % (ref 0.0–3.0)
Eosinophils Absolute: 0.3 10*3/uL (ref 0.0–0.7)
Eosinophils Relative: 3.6 % (ref 0.0–5.0)
HCT: 48.3 % (ref 39.0–52.0)
Hemoglobin: 16.5 g/dL (ref 13.0–17.0)
Lymphocytes Relative: 43 % (ref 12.0–46.0)
Lymphs Abs: 3.3 10*3/uL (ref 0.7–4.0)
MCHC: 34.2 g/dL (ref 30.0–36.0)
MCV: 90.1 fL (ref 78.0–100.0)
Monocytes Absolute: 0.6 10*3/uL (ref 0.1–1.0)
Monocytes Relative: 8 % (ref 3.0–12.0)
Neutro Abs: 3.4 10*3/uL (ref 1.4–7.7)
Neutrophils Relative %: 44.4 % (ref 43.0–77.0)
Platelets: 273 10*3/uL (ref 150.0–400.0)
RBC: 5.36 Mil/uL (ref 4.22–5.81)
RDW: 12.3 % (ref 11.5–15.5)
WBC: 7.7 10*3/uL (ref 4.0–10.5)

## 2023-08-08 LAB — LIPID PANEL
Cholesterol: 241 mg/dL — ABNORMAL HIGH (ref 0–200)
HDL: 60.2 mg/dL (ref >39.00)
LDL Cholesterol: 153 mg/dL — ABNORMAL HIGH (ref 0–99)
NonHDL: 180.75
Total CHOL/HDL Ratio: 4
Triglycerides: 138 mg/dL (ref 0.0–149.0)
VLDL: 27.6 mg/dL (ref 0.0–40.0)

## 2023-08-08 LAB — COMPREHENSIVE METABOLIC PANEL
ALT: 20 U/L (ref 0–53)
AST: 21 U/L (ref 0–37)
Albumin: 4.7 g/dL (ref 3.5–5.2)
Alkaline Phosphatase: 71 U/L (ref 39–117)
BUN: 25 mg/dL — ABNORMAL HIGH (ref 6–23)
CO2: 25 meq/L (ref 19–32)
Calcium: 9.6 mg/dL (ref 8.4–10.5)
Chloride: 103 meq/L (ref 96–112)
Creatinine, Ser: 1.29 mg/dL (ref 0.40–1.50)
GFR: 58.26 mL/min — ABNORMAL LOW (ref >60.00)
Glucose, Bld: 88 mg/dL (ref 70–99)
Potassium: 4.5 meq/L (ref 3.5–5.1)
Sodium: 137 meq/L (ref 135–145)
Total Bilirubin: 1.5 mg/dL — ABNORMAL HIGH (ref 0.2–1.2)
Total Protein: 7.3 g/dL (ref 6.0–8.3)

## 2023-08-08 LAB — HEMOGLOBIN A1C: Hgb A1c MFr Bld: 5.6 % (ref 4.6–6.5)

## 2023-08-08 MED ORDER — TRIAMCINOLONE ACETONIDE 0.1 % EX CREA: 1.0000 | TOPICAL_CREAM | Freq: Two times a day (BID) | CUTANEOUS | 0 refills | Status: AC

## 2023-08-08 NOTE — Patient Instructions (Addendum)
Rash- b-this appears to be contact dermatitis- will treat with triamcinolone for 7-10 days- he will let me know if recurs or worsens or fails to improve  EKG today  Stephen Sloan , Thank you for taking time to come for your Medicare Wellness Visit. I appreciate your ongoing commitment to your health goals. Please review the following plan we discussed and let me know if I can assist you in the future.   These are the goals we discussed: Goal exercise 150 minutes per week  This is a list of the screening recommended for you and due dates:  Health Maintenance  Topic Date Due   Zoster (Shingles) Vaccine (1 of 2) Never done   COVID-19 Vaccine (2 - 2024-25 season) 03/24/2023   Flu Shot  10/21/2023*   Pneumonia Vaccine (1 of 2 - PCV) 08/07/2024*   Medicare Annual Wellness Visit  08/07/2024   Colon Cancer Screening  11/01/2025   DTaP/Tdap/Td vaccine (2 - Td or Tdap) 12/22/2025   Hepatitis C Screening  Completed   HIV Screening  Completed   HPV Vaccine  Aged Out  *Topic was postponed. The date shown is not the original due date.

## 2023-08-08 NOTE — Progress Notes (Signed)
Phone: [phone]   Subjective:  Patient presents today for their Welcome to Medicare Exam    Preventive Screening-Counseling & Management  Vision screen: just had check with Dr. Dione Booze and exam excellent today Vision Screening   Right eye Left eye Both eyes  Without correction     With correction 20/16/ 20/16 20/16    Advanced directives: full code. Has living will and hcpoa- his wife. Just did last year. Discussed can have uploaded in epic if desired  Modifiable Risk Factors/behavioral risk assessment/psychosocial risk assessment Regular exercise: not running as much due to knee but is doing 6000 at least steps a day-Has a lot of heavy projects including shoveling snow and working on trees lately- will get more active in [place] when heads down soon Diet:  .  Weight down 3 pounds from last year.  Congratulated efforts.  Most of adult life has been in the 1 95-200 range so slightly above this at home at 203 Wt Readings from Last 3 Encounters:  08/08/23 210 lb 3.2 oz (95.3 kg)  11/02/22 208 lb (94.3 kg)  10/17/22 208 lb (94.3 kg)   Smoking Status: Never Smoker Second Hand Smoking status: No smokers in home Alcohol intake: 0 per week Other substance abuse/illicit drugs: none  Cardiac risk factors:  advanced age (older than 57 for men, 32 for women)  known Hyperlipidemia - working on lifestyle. Reassuring CT calcium scoring though no Hypertension  No diabetes. But can screen today Family History:  father with heart attack in 95s, paternal girlfriend MI age 28   Depression Screen/risk evaluation Risk factors: none. PHQ2 0     08/08/2023    7:54 AM 08/01/2022    9:48 AM 07/31/2021    1:04 PM  Depression screen PHQ 2/9  Decreased Interest 0 0 0  Down, Depressed, Hopeless 0 0 0  PHQ - 2 Score 0 0 0  Altered sleeping 0    Tired, decreased energy 0    Change in appetite 0    Feeling bad or failure about yourself  0    Trouble concentrating 0    Moving slowly or  fidgety/restless 0    Suicidal thoughts 0    PHQ-9 Score 0    Difficult doing work/chores Not difficult at all      Functional ability and level of safety Mobility assessment:  timed get up and go <12 seconds Activities of Daily Living- Independent in ADLs (toileting, bathing, dressing, transferring, eating) and in IADLs (shopping, housekeeping, managing own medications, and handling finances) Home Safety: Loose rugs (none), smoke detectors (up to date ), small pets (yes but no fall issues), grab bars (not yet- not needing), stairs (10 steps but doesn't go up regularly and traverses easily), life-alert system (would use phone) Hearing Difficulties: patient declines Fall Risk: None     08/08/2023    7:54 AM  Fall Risk   Falls in the past year? 0  Number falls in past yr: 0  Injury with Fall? 0  Risk for fall due to : No Fall Risks  Follow up Falls evaluation completed   Opioid use history:  no long term opioids use Self assessment of health status: fantastic  Required Immunizations needed today:  reports had shingrix with [facility]- we didn't receive copy yet. Holding off on COVID and flu shot. Declines Prevnar 20 Immunization History  Administered Date(s) Administered   Janssen (J&J) SARS-COV-2 Vaccination 11/23/2019   Tdap 12/23/2015   Health Maintenance  Topic Date  Due   Zoster (Shingles) Vaccine (1 of 2) Never done   COVID-19 Vaccine (2 - 2024-25 season) 03/24/2023   Flu Shot  10/21/2023*   Pneumonia Vaccine (1 of 2 - PCV) 08/07/2024*   Medicare Annual Wellness Visit  08/07/2024   Colon Cancer Screening  11/01/2025   DTaP/Tdap/Td vaccine (2 - Td or Tdap) 12/22/2025   Hepatitis C Screening  Completed   HIV Screening  Completed   HPV Vaccine  Aged Out  *Topic was postponed. The date shown is not the original due date.    Screening tests-  Colon cancer screening- Colonoscopy with Dr. 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   Enlarged prostate    Hypercholesteremia    Patient Active Problem List   Diagnosis Date Noted   BPH associated with nocturia 07/31/2021    Priority: Medium    Dengue fever     Priority: Low   Hyperlipidemia 10/03/2022   Insomnia 10/03/2022   Past Surgical History:  Procedure Laterality Date   COLONOSCOPY     HERNIA REPAIR  1960   double hernia, as a child   PROSTATE BIOPSY     around 2015 benign   Family History  Problem Relation Age of Onset   Ovarian cancer Mother        age related death not cancer related   Hearing loss Mother    CAD Father        heart attack in 30s, ongoing angina   Non-Hodgkin's lymphoma Father        stage III at 69   Hyperlipidemia Father        overweight   Heart attack Father    Prostatitis Father    Cancer Father    Heart disease Father    Kidney disease Father    Obesity Sister    Arthritis Sister        hips and knees   Obesity Brother    CAD Brother        heart attack at 19- smoker, drinker, over 300 lbs   CAD Paternal Grandfather        MI age 60   Heart attack Paternal Grandfather    Stomach cancer Paternal Uncle    Colon cancer Neg Hx    Colon polyps Neg Hx    Esophageal cancer Neg Hx    Rectal cancer Neg Hx    Medications- reviewed and updated Current Outpatient Medications  Medication Sig Dispense Refill   acetaminophen (TYLENOL) 325 MG tablet Take 650 mg by mouth every 6 (six) hours as needed for mild pain.     aspirin EC 81 MG tablet Take 81 mg by mouth once.     finasteride (PROSCAR) 5 MG tablet Take 5 mg by mouth daily.     nortriptyline (PAMELOR) 10 MG capsule TAKE 1 CAPSULE BY MOUTH EVERYDAY AT  BEDTIME 90 capsule 2   triamcinolone cream (KENALOG) 0.1 % Apply 1 Application topically 2 (two) times daily. For 7-10 days maximum 80 g 0   No current facility-administered medications for this visit.    Allergies-reviewed and updated Allergies  Allergen Reactions   Levaquin [Levofloxacin] Rash  Suspect but pt unsure, on multiple abx this week   Rocephin [Ceftriaxone Sodium In Dextrose] Rash    Suspect this drug, patient unsure as was on multiple abx this week    Social History   Socioeconomic History   Marital status: Married    Spouse name: Not on file   Number of children: 4   Years of education: Not on file   Highest education level: Not on file  Occupational History   Occupation: Optician, dispensing  Tobacco Use   Smoking status: Never   Smokeless tobacco: Never  Vaping Use   Vaping status: Never Used  Substance and Sexual Activity   Alcohol use: No   Drug use: No   Sexual activity: Yes    Partners: Female  Other Topics Concern   Not on file  Social History Narrative   Married. 4 kids. 13 grandkids.       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       Hobbies: enjoys jogging around [employer], travel      Social Drivers of Corporate investment banker Strain: no issues  Food Insecurity: No issues  Transportation Needs: No issues  Physical Activity: 6000 steps a aday and active with heavy projects at home  Stress: work related stress as Education officer, environmental  Social Connections: good family/friend/church support    Objective  Objective:  BP 112/80   Pulse 71   Temp (!) 97 F (36.1 C)   Ht 5' 11.5" (1.816 m)   Wt 210 lb 3.2 oz (95.3 kg)   SpO2 97%   BMI 28.91 kg/m  Gen: NAD, resting comfortably HEENT: Mucous membranes are moist. Oropharynx normal Neck: no thyromegaly CV: RRR no murmurs rubs or gallops Lungs: CTAB no crackles, wheeze, rhonchi Abdomen: soft/nontender/nondistended/normal bowel sounds. No rebound or guarding.  Ext: no edema Skin: warm,  dry Neuro: grossly normal, moves all extremities, PERRLA  EKG: normal sinus rhythm with rate 66, normal axis, normal intervals, no hypertrophy, no st or t wave changes     Assessment and Plan:   Welcome to Medicare exam completed-  Educated, counseled and referred based on above elements Educated, counseled and referred as appropriate for preventative needs Discussed and documented a written plan for preventiative services and screenings with personalized health advice- After Visit Summary was given to patient which included this plan  4. 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 This is a benign finding which requires no imaging follow-up."  # BPH- follows with Dr. Berneice Heinrich S:medication: finasteride 5 mg  -did have hematuria but had workup with Dr. Berneice Heinrich and reassuring - thought related to enlarged prostate A/P: BPH stable continue current medications and follow up with urology especially for elevated PSA in 8 range and sees them next month   #hyperlipidemia but with CT calcium scoring of 0 in 2023 despite family history S: Medication: none   A/P: does have elevated cholesterol but reassuring CT calcium scoring and wants to hold off on medication- update today  # Stress management-amitriptyline helpful in 2002 with work/church related stress and without side effects.  Opted in 2023 to trial nortriptyline 10 mg due to lower side effect profile and has been helpful   #rash on arm- cut down a tree  and developed rash on bilateral forearms despite wearing longsleeve. Very itchy. Benadryl cream helps some. About 2 weeks - mildly improving. 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 Welcome to Medicare preventive visit  Z00.00 EKG 12-Lead    2. Hyperlipidemia, unspecified hyperlipidemia type  E78.5 Comprehensive metabolic panel    CBC with Differential/Platelet    Lipid panel    EKG 12-Lead    3. Screening for diabetes mellitus  Z13.1 Hemoglobin A1c    4. Overweight  E66.3 Hemoglobin A1c      Meds ordered this encounter  Medications   triamcinolone cream (KENALOG) 0.1 %    Sig: Apply 1 Application topically 2 (two) times daily. For 7-10 days maximum    Dispense:  80 g    Refill:  0    Return precautions advised. Tana Conch, MD

## 2023-08-09 ENCOUNTER — Other Ambulatory Visit: Payer: Self-pay

## 2023-08-09 DIAGNOSIS — R944 Abnormal results of kidney function studies: Secondary | ICD-10-CM

## 2023-09-14 ENCOUNTER — Other Ambulatory Visit: Payer: Self-pay | Admitting: Nurse Practitioner

## 2023-09-14 DIAGNOSIS — R972 Elevated prostate specific antigen [PSA]: Secondary | ICD-10-CM

## 2023-09-26 ENCOUNTER — Other Ambulatory Visit: Payer: Self-pay | Admitting: Family Medicine

## 2023-10-31 ENCOUNTER — Ambulatory Visit
Admission: RE | Admit: 2023-10-31 | Discharge: 2023-10-31 | Disposition: A | Discharge status: Home / Self Care | Payer: Medicare Other | Source: Ambulatory Visit | Attending: Nurse Practitioner

## 2023-10-31 DIAGNOSIS — R972 Elevated prostate specific antigen [PSA]: Secondary | ICD-10-CM

## 2023-10-31 MED ORDER — GADOPICLENOL 0.5 MMOL/ML IV SOLN
10.0000 mL | Freq: Once | INTRAVENOUS | Status: AC | PRN
Start: 2023-10-31 — End: 2023-10-31
  Administered 2023-10-31: 10 mL via INTRAVENOUS

## 2023-11-04 ENCOUNTER — Other Ambulatory Visit: Payer: Self-pay | Admitting: Family Medicine

## 2023-11-04 NOTE — Telephone Encounter (Signed)
 Copied from CRM [id number]. Topic: Clinical - Medication Refill >> Nov 04, 2023  9:57 AM [place] J wrote: Most Recent Primary Care Visit:  Provider: Almira Jaeger  Department: [facility]  Visit Type: OFFICE VISIT  Date: 08/08/2023  Medication: nortriptyline (PAMELOR) 10 MG capsule  Has the patient contacted their pharmacy? Yes (Agent: If no, request that the patient contact the pharmacy for the refill. If patient does not wish to contact the pharmacy document the reason why and proceed with request.) (Agent: If yes, when and what did the pharmacy advise?) No more refills through pharmacy. Is this the correct pharmacy for this prescription? Yes If no, delete pharmacy and type the correct one.  This is the patient's preferred pharmacy:   [id number] - [place], [place] - [address] [place] [address] Phone: [phone] Fax: [phone]  Has the prescription been filled recently? No  Is the patient out of the medication? Yes  Has the patient been seen for an appointment in the last year OR does the patient have an upcoming appointment? Yes  Can we respond through MyChart? Yes  Agent: Please be advised that Rx refills may take up to 3 business days. We ask that you follow-up with your pharmacy.

## 2023-11-04 NOTE — Telephone Encounter (Signed)
 Called [facility], they state patient has the prescription available for refill. Filling for patient. Cancelled reorder. Closing encounter.

## 2024-06-29 LAB — OPHTHALMOLOGY REPORT-SCANNED

## 2024-08-13 ENCOUNTER — Ambulatory Visit: Payer: Medicare Other | Admitting: Family Medicine

## 2024-08-25 ENCOUNTER — Ambulatory Visit: Admitting: Family Medicine

## 2024-09-10 ENCOUNTER — Ambulatory Visit: Admitting: Family Medicine
